# Patient Record
Sex: Female | Born: 1940 | Race: White | Hispanic: No | Smoking: Former smoker
Health system: Southern US, Community
[De-identification: ages and names within clinical notes are randomized; demographics above are authoritative.]

## PROBLEM LIST (undated history)

## (undated) DIAGNOSIS — K317 Polyp of stomach and duodenum: Secondary | ICD-10-CM

## (undated) DIAGNOSIS — M1990 Unspecified osteoarthritis, unspecified site: Secondary | ICD-10-CM

## (undated) DIAGNOSIS — K219 Gastro-esophageal reflux disease without esophagitis: Secondary | ICD-10-CM

## (undated) DIAGNOSIS — M810 Age-related osteoporosis without current pathological fracture: Secondary | ICD-10-CM

## (undated) DIAGNOSIS — D649 Anemia, unspecified: Secondary | ICD-10-CM

## (undated) DIAGNOSIS — C4491 Basal cell carcinoma of skin, unspecified: Secondary | ICD-10-CM

## (undated) DIAGNOSIS — K635 Polyp of colon: Secondary | ICD-10-CM

## (undated) DIAGNOSIS — M25562 Pain in left knee: Secondary | ICD-10-CM

## (undated) DIAGNOSIS — C50919 Malignant neoplasm of unspecified site of unspecified female breast: Secondary | ICD-10-CM

## (undated) DIAGNOSIS — I8390 Asymptomatic varicose veins of unspecified lower extremity: Secondary | ICD-10-CM

## (undated) HISTORY — DX: Basal cell carcinoma of skin, unspecified: C44.91

## (undated) HISTORY — DX: Malignant neoplasm of unspecified site of unspecified female breast: C50.919

## (undated) HISTORY — DX: Unspecified osteoarthritis, unspecified site: M19.90

## (undated) HISTORY — DX: Age-related osteoporosis without current pathological fracture: M81.0

## (undated) HISTORY — DX: Anemia, unspecified: D64.9

## (undated) HISTORY — DX: Asymptomatic varicose veins of unspecified lower extremity: I83.90

## (undated) HISTORY — DX: Gastro-esophageal reflux disease without esophagitis: K21.9

## (undated) HISTORY — DX: Polyp of colon: K63.5

## (undated) HISTORY — DX: Pain in left knee: M25.562

## (undated) HISTORY — PX: TONSILLECTOMY: SUR1361

## (undated) HISTORY — DX: Polyp of stomach and duodenum: K31.7

---

## 1989-06-23 HISTORY — PX: OTHER SURGICAL HISTORY: SHX169

## 1993-10-23 DIAGNOSIS — C50919 Malignant neoplasm of unspecified site of unspecified female breast: Secondary | ICD-10-CM

## 1993-10-23 HISTORY — PX: BREAST LUMPECTOMY: SHX2

## 1993-10-23 HISTORY — DX: Malignant neoplasm of unspecified site of unspecified female breast: C50.919

## 1997-11-27 ENCOUNTER — Ambulatory Visit (HOSPITAL_COMMUNITY): Admission: RE | Admit: 1997-11-27 | Discharge: 1997-11-27 | Payer: Self-pay | Admitting: Obstetrics and Gynecology

## 1998-12-10 ENCOUNTER — Other Ambulatory Visit: Admission: RE | Admit: 1998-12-10 | Discharge: 1998-12-10 | Payer: Self-pay | Admitting: Obstetrics and Gynecology

## 1999-09-01 ENCOUNTER — Ambulatory Visit (HOSPITAL_COMMUNITY): Admission: RE | Admit: 1999-09-01 | Discharge: 1999-09-01 | Payer: Self-pay | Admitting: Gastroenterology

## 1999-12-13 ENCOUNTER — Other Ambulatory Visit: Admission: RE | Admit: 1999-12-13 | Discharge: 1999-12-13 | Payer: Self-pay | Admitting: Obstetrics and Gynecology

## 2001-01-14 ENCOUNTER — Other Ambulatory Visit: Admission: RE | Admit: 2001-01-14 | Discharge: 2001-01-14 | Payer: Self-pay | Admitting: Obstetrics and Gynecology

## 2002-05-01 ENCOUNTER — Other Ambulatory Visit: Admission: RE | Admit: 2002-05-01 | Discharge: 2002-05-01 | Payer: Self-pay | Admitting: Obstetrics and Gynecology

## 2004-06-01 ENCOUNTER — Other Ambulatory Visit: Admission: RE | Admit: 2004-06-01 | Discharge: 2004-06-01 | Payer: Self-pay | Admitting: Obstetrics and Gynecology

## 2005-02-20 DIAGNOSIS — K635 Polyp of colon: Secondary | ICD-10-CM

## 2005-02-20 HISTORY — DX: Polyp of colon: K63.5

## 2005-07-19 ENCOUNTER — Other Ambulatory Visit: Admission: RE | Admit: 2005-07-19 | Discharge: 2005-07-19 | Payer: Self-pay | Admitting: Obstetrics and Gynecology

## 2009-05-23 DIAGNOSIS — D649 Anemia, unspecified: Secondary | ICD-10-CM

## 2009-05-23 HISTORY — DX: Anemia, unspecified: D64.9

## 2009-06-23 DIAGNOSIS — K317 Polyp of stomach and duodenum: Secondary | ICD-10-CM

## 2009-06-23 HISTORY — DX: Polyp of stomach and duodenum: K31.7

## 2009-06-23 LAB — HM COLONOSCOPY

## 2011-09-26 LAB — HM MAMMOGRAPHY

## 2011-12-20 ENCOUNTER — Other Ambulatory Visit (INDEPENDENT_AMBULATORY_CARE_PROVIDER_SITE_OTHER): Payer: Medicare Other

## 2011-12-20 ENCOUNTER — Ambulatory Visit (INDEPENDENT_AMBULATORY_CARE_PROVIDER_SITE_OTHER): Payer: Medicare Other | Admitting: Obstetrics and Gynecology

## 2011-12-20 DIAGNOSIS — M810 Age-related osteoporosis without current pathological fracture: Secondary | ICD-10-CM

## 2012-01-25 ENCOUNTER — Ambulatory Visit (HOSPITAL_COMMUNITY)
Admission: RE | Admit: 2012-01-25 | Discharge: 2012-01-25 | Disposition: A | Discharge status: Home / Self Care | Payer: Medicare Other | Source: Ambulatory Visit | Attending: Obstetrics and Gynecology | Admitting: Obstetrics and Gynecology

## 2012-01-25 ENCOUNTER — Other Ambulatory Visit: Payer: Self-pay | Admitting: Obstetrics and Gynecology

## 2012-01-25 DIAGNOSIS — Z13820 Encounter for screening for osteoporosis: Secondary | ICD-10-CM | POA: Insufficient documentation

## 2012-01-25 DIAGNOSIS — Z780 Asymptomatic menopausal state: Secondary | ICD-10-CM | POA: Insufficient documentation

## 2012-05-01 ENCOUNTER — Encounter: Payer: Self-pay | Admitting: *Deleted

## 2012-05-02 ENCOUNTER — Encounter: Payer: Self-pay | Admitting: *Deleted

## 2012-07-18 ENCOUNTER — Encounter: Payer: Self-pay | Admitting: Family Medicine

## 2012-07-18 ENCOUNTER — Ambulatory Visit (INDEPENDENT_AMBULATORY_CARE_PROVIDER_SITE_OTHER): Payer: Medicare Other | Admitting: Family Medicine

## 2012-07-18 VITALS — BP 130/78 | HR 64 | Ht 64.0 in | Wt 136.0 lb

## 2012-07-18 DIAGNOSIS — R6889 Other general symptoms and signs: Secondary | ICD-10-CM

## 2012-07-18 DIAGNOSIS — Z79899 Other long term (current) drug therapy: Secondary | ICD-10-CM

## 2012-07-18 DIAGNOSIS — Z23 Encounter for immunization: Secondary | ICD-10-CM

## 2012-07-18 DIAGNOSIS — D509 Iron deficiency anemia, unspecified: Secondary | ICD-10-CM | POA: Insufficient documentation

## 2012-07-18 LAB — CBC WITH DIFFERENTIAL/PLATELET
Basophils Absolute: 0.1 10*3/uL (ref 0.0–0.1)
Eosinophils Relative: 1 % (ref 0–5)
HCT: 41.1 % (ref 36.0–46.0)
Hemoglobin: 14 g/dL (ref 12.0–15.0)
Lymphocytes Relative: 27 % (ref 12–46)
MCV: 91.9 fL (ref 78.0–100.0)
Monocytes Absolute: 0.5 10*3/uL (ref 0.1–1.0)
Monocytes Relative: 11 % (ref 3–12)
RDW: 12.7 % (ref 11.5–15.5)
WBC: 4.5 10*3/uL (ref 4.0–10.5)

## 2012-07-18 LAB — COMPREHENSIVE METABOLIC PANEL
ALT: 16 U/L (ref 0–35)
AST: 20 U/L (ref 0–37)
Alkaline Phosphatase: 49 U/L (ref 39–117)
Sodium: 140 mEq/L (ref 135–145)
Total Bilirubin: 0.8 mg/dL (ref 0.3–1.2)
Total Protein: 6.5 g/dL (ref 6.0–8.3)

## 2012-07-18 MED ORDER — INFLUENZA VIRUS VACC SPLIT PF IM SUSP: 0.5000 mL | Freq: Once | INTRAMUSCULAR | Status: DC

## 2012-07-18 MED ORDER — OMEPRAZOLE 20 MG PO CPDR: 20.0000 mg | DELAYED_RELEASE_CAPSULE | Freq: Every day | ORAL | Status: DC

## 2012-07-18 NOTE — Patient Instructions (Addendum)
Please try and cut back to just one glass of wine each night. I would try [facility] or other topical medication instead of Voltaren to your arm muscle.  I recommend trial of Active Release technique, performed by certain chiropractors ,including those at [facility] on [address] (Dr. Thereasa Distance).  I have also heard good things about Okey Regal, a massage therapist who does myofascial massage for pain relief.  Take omeprazole every day to protect your stomach from the naproxen you are taking daily for joint pains.

## 2012-07-18 NOTE — Progress Notes (Signed)
Chief Complaint  Patient presents with  . Former patient to re-establish    patient is fasting today.   HPI: Patient presents to re-establish care, wanting to have fasting labs done today.  She is complaining of feeling cold frequently, which is new for her.  She has a h/o iron deficiency anemia.  She denies abdominal pain, black or bloody stools, heartburn or dysphagia.  Stopped taking omeprazole because no longer having symptoms/reflux.  She continues to take NSAIDs daily for joint pains.  Plays tennis 5x/week.  Has pain in her R upper arm with certain movements, in the muscle.  Denies any shoulder joint pain.  Using Voltaren gel with some benefit, less sore.  Icing seems to help.  Hurts when she reaches across and twists in certain movements (ie when playing tennis).  Currently isn't painful.  Had some R hip pain, after traveling 3 weeks in [place] in small car.  Past Medical History  Diagnosis Date  . Breast cancer     h/o left breast CA-s/p lumpectomy,radiation and Tamoxifen x 5years  . GERD (gastroesophageal reflux disease)     Dr.Mann  . OA (osteoarthritis)   . Varicose veins   . Osteopenia   . Left knee pain     h/o-Dr.Collins  . BCC (basal cell carcinoma)     RLE-Dr.Lomax  . Colon polyps 5/06    hyperplastic  . Anemia 05/2009    iron deficient  . Gastric polyps 06/2009    small(Dr.Mann) EGD and Colonoscopy   . Osteoporosis     managed by Dr. Pennie Rushing   Past Surgical History  Procedure Date  . Upper gastrointestinal endoscopy 07/05/2009    normal esophagus,GEJ and small bowel. Multiple small vessel gastric polyps ablated with tip of snare-four removed.  . Tonsillectomy age 81  . Breast lumpectomy 1995    left breast  . Varicose vein treatment 1990's    LLE   History   Social History  . Marital Status: Married    Spouse Name: N/A    Number of Children: 3  . Years of Education: N/A   Occupational History  . retired Database administrator    Social  History Main Topics  . Smoking status: Former Smoker    Quit date: 10/23/1981  . Smokeless tobacco: Never Used  . Alcohol Use: Yes     14 glasses of red wine per week.  . Drug Use: No  . Sexually Active: Not on file   Other Topics Concern  . Not on file   Social History Narrative   Married, lives with husband, 2 cats, 1 dog.  Has 3 sons, one with alcoholism (did well in rehab at [facility]), 1 in [place], 1 in [place]   Family History  Problem Relation Age of Onset  . Hypertension Mother   . Osteoporosis Mother   . Alcohol abuse Son   . Heart disease Maternal Uncle   . Cancer Paternal Grandmother 69    breast cancer  . Diabetes Neg Hx    Current outpatient prescriptions:aspirin 81 MG tablet, Take 81 mg by mouth daily., Disp: , Rfl: ;  calcium carbonate (OS-CAL) 600 MG TABS, Take 600 mg by mouth 2 (two) times daily with a meal., Disp: , Rfl: ;  ferrous sulfate 325 (65 FE) MG tablet, Take 650 mg by mouth daily with breakfast. , Disp: , Rfl: ;  glucosamine-chondroitin 500-400 MG tablet, Take 2 tablets by mouth daily. , Disp: , Rfl:  Multiple Vitamins-Minerals (MULTIVITAMIN  WITH MINERALS) tablet, Take 1 tablet by mouth daily., Disp: , Rfl: ;  naproxen (NAPROSYN) 500 MG tablet, Take 500 mg by mouth 2 (two) times daily with a meal., Disp: , Rfl: ;  raloxifene (EVISTA) 60 MG tablet, Take 60 mg by mouth daily., Disp: , Rfl: ;  omeprazole (PRILOSEC) 20 MG capsule, Take 20 mg by mouth daily., Disp: , Rfl:  Current facility-administered medications:influenza  inactive virus vaccine (FLUZONE/FLUARIX) injection 0.5 mL, 0.5 mL, Intramuscular, Once, Joselyn Arrow, MD  No Known Allergies  ROS:  Denies fevers, weight changes, URI symptoms, cough, shortness of breath, chest pain, palpitations, nausea, vomiting, bowel changes, urinary complaints, skin rashes, depression/anxiety or other concerns, except as noted in HPI.  PHYSICAL EXAM: BP 130/78  Pulse 64  Ht 5\' 4"  (1.626 m)  Wt 136 lb (61.689  kg)  BMI 23.34 kg/m2 Well developed, pleasant female in no distress HEENT:  PERRL, EOMI, conjunctiva clear.  OP clear Neck: no lymphadenopathy, thyromegaly, mass or bruit Heart: regular rate and rhythm without murmur Lungs: clear bilaterally Abdomen: soft, nontender, no organomegaly or mass, normal bowel sounds. Extremities: no clubbing, cyanosis or edema, 2+ pulse. Slight tenderness anterior R shoulder with certain movements.  No swelling, warmth.  FROM Skin: no rash/lesions Psych: normal mood, affect, hygiene and grooming  ASSESSMENT/PLAN: 1. Iron deficiency anemia  CBC with Differential, Iron, Ferritin  2. Need for prophylactic vaccination and inoculation against influenza  influenza  inactive virus vaccine (FLUZONE/FLUARIX) injection 0.5 mL  3. Encounter for long-term (current) use of other medications  Comprehensive metabolic panel  4. Temperature intolerance  TSH    Had Tdap through [facility] after injury within the last 2 months--get date to enter in computer (signing another release today). 08/2010 normal vitamin D and lipids--these don't need to be repeated.  Check CBC, c-met and TSH, ferritin and iron given her history and complaints.  Reviewed risks of NSAIDs.  Encouraged restarting omeprazole daily for prevention of ulcers/GI complications from chronic NSAIDs.  Discussed need to monitor kidney/liver while on NSAIDs.  Stop Voltaren--may not be getting much additional benefit from oral NSAIDs.  Can try other topicals such as icy hot.  Shown some stretches.  Recommend heat/ice.  Can try massage or chiropractor (vs refer for PT) for ongoing pain in RUE (numbers given)

## 2012-07-19 ENCOUNTER — Encounter: Payer: Self-pay | Admitting: Family Medicine

## 2012-07-19 LAB — FERRITIN: Ferritin: 20 ng/mL (ref 10–291)

## 2012-08-15 ENCOUNTER — Telehealth: Payer: Self-pay | Admitting: Obstetrics and Gynecology

## 2012-08-15 NOTE — Telephone Encounter (Signed)
[facility] pt 

## 2012-08-16 NOTE — Telephone Encounter (Signed)
Tc to pt per telephone call. Pt wants to proceed with Prolia discussed with vph in 11/2011. Informed pt will begin  Prolia benefit verification process and fu with pt accordingly. Pt voices understanding.

## 2012-09-12 ENCOUNTER — Telehealth: Payer: Self-pay

## 2012-09-12 NOTE — Telephone Encounter (Signed)
Pt given all financial information rgdg Prolia. Pt opts to cb if decides to proceed with injection. Pt agrees.

## 2013-01-02 ENCOUNTER — Other Ambulatory Visit: Payer: Self-pay | Admitting: Family Medicine

## 2013-01-02 ENCOUNTER — Ambulatory Visit: Payer: Medicare Other | Admitting: Family Medicine

## 2013-01-02 ENCOUNTER — Encounter: Payer: Self-pay | Admitting: Family Medicine

## 2013-01-02 VITALS — BP 122/80 | HR 68 | Ht 64.0 in | Wt 136.0 lb

## 2013-01-02 DIAGNOSIS — D509 Iron deficiency anemia, unspecified: Secondary | ICD-10-CM

## 2013-01-02 DIAGNOSIS — Z0000 Encounter for general adult medical examination without abnormal findings: Secondary | ICD-10-CM

## 2013-01-02 DIAGNOSIS — M810 Age-related osteoporosis without current pathological fracture: Secondary | ICD-10-CM | POA: Insufficient documentation

## 2013-01-02 LAB — COMPREHENSIVE METABOLIC PANEL
ALT: 13 U/L (ref 0–35)
Albumin: 4.5 g/dL (ref 3.5–5.2)
CO2: 27 mEq/L (ref 19–32)
Glucose, Bld: 90 mg/dL (ref 70–99)
Potassium: 4.2 mEq/L (ref 3.5–5.3)
Sodium: 139 mEq/L (ref 135–145)
Total Bilirubin: 0.7 mg/dL (ref 0.3–1.2)
Total Protein: 6.6 g/dL (ref 6.0–8.3)

## 2013-01-02 LAB — CBC WITH DIFFERENTIAL/PLATELET
Hemoglobin: 12 g/dL (ref 12.0–15.0)
Lymphocytes Relative: 27 % (ref 12–46)
Lymphs Abs: 1.5 10*3/uL (ref 0.7–4.0)
Monocytes Relative: 9 % (ref 3–12)
Neutro Abs: 3.3 10*3/uL (ref 1.7–7.7)
Neutrophils Relative %: 61 % (ref 43–77)
Platelets: 292 10*3/uL (ref 150–400)
RBC: 4.44 MIL/uL (ref 3.87–5.11)
WBC: 5.4 10*3/uL (ref 4.0–10.5)

## 2013-01-02 LAB — LIPID PANEL
Cholesterol: 237 mg/dL — ABNORMAL HIGH (ref 0–200)
HDL: 83 mg/dL (ref >39)
Total CHOL/HDL Ratio: 2.9 Ratio
Triglycerides: 96 mg/dL (ref <150)

## 2013-01-02 LAB — POCT URINALYSIS DIPSTICK
Bilirubin, UA: NEGATIVE
Blood, UA: NEGATIVE
Glucose, UA: NEGATIVE
Leukocytes, UA: NEGATIVE
Nitrite, UA: NEGATIVE
Urobilinogen, UA: NEGATIVE

## 2013-01-02 NOTE — Patient Instructions (Signed)

## 2013-01-02 NOTE — Progress Notes (Signed)
Chief Complaint  Patient presents with  . Annual Exam    fasting annual exam/med check. Did not do vision today as she just had one. Has a place on her back that she would like you to look at. No pap, sees gyn and is UTD.   Evelyn White is a 72 y.o. female who presents for a complete physical.  She has the following concerns:  Seeing Dr. Thereasa Distance for L leg pain (resolved) and R arm and leg pain. Husband has been very ill (pneumonia, lung nodules, suspected cancer, with large pleural effusions)  Continues to drink 2 glasses of wine each night, sometimes a third to help her relax from the recent stress  Immunization History  Administered Date(s) Administered  . Influenza Split 07/18/2012  . Pneumococcal Polysaccharide 01/22/2008  . Td 07/20/2004  . Tdap 04/26/2012  . Zoster 06/02/2009   Last Pap smear:  Through Dr. Pennie Rushing, UTD Last mammogram:  Yesterday, normal Last colonoscopy: 06/2009 Last DEXA:  1 year ago with Dr. Pennie Rushing.  Believes she may be starting Prolia injections soon Dentist: twice year Ophtho: yearly Exercise: tennis 5x/week; personal trainer 2x/week (weights and cardio) Has living will and healthcare power of attorney  Past Medical History  Diagnosis Date  . Breast cancer     h/o left breast CA-s/p lumpectomy,radiation and Tamoxifen x 5years  . GERD (gastroesophageal reflux disease)     Dr.Mann  . OA (osteoarthritis)   . Varicose veins   . Left knee pain     h/o-Dr.Collins  . BCC (basal cell carcinoma)     RLE-Dr.Lomax  . Colon polyps 5/06    hyperplastic  . Anemia 05/2009    iron deficient  . Gastric polyps 06/2009    small(Dr.Mann) EGD and Colonoscopy   . Osteoporosis     managed by Dr. Pennie Rushing  . Breast cancer 1995    left    Past Surgical History  Procedure Laterality Date  . Upper gastrointestinal endoscopy  07/05/2009    normal esophagus,GEJ and small bowel. Multiple small vessel gastric polyps ablated with tip of snare-four removed.   . Tonsillectomy  age 27  . Breast lumpectomy  1995    left breast  . Varicose vein treatment  1990's    LLE    History   Social History  . Marital Status: Married    Spouse Name: N/A    Number of Children: 3  . Years of Education: N/A   Occupational History  . retired Database administrator    Social History Main Topics  . Smoking status: Former Smoker    Quit date: 10/23/1981  . Smokeless tobacco: Never Used  . Alcohol Use: Yes     Comment: 14 glasses of red wine per week. (2/day)  . Drug Use: No  . Sexually Active: Not on file   Other Topics Concern  . Not on file   Social History Narrative   Married, lives with husband, 2 cats, 1 dog.  Has 3 sons, one with alcoholism (did well in rehab at [facility]), 1 in [place], 1 in [place]. 2 granddaughters    Family History  Problem Relation Age of Onset  . Hypertension Mother   . Osteoporosis Mother   . Alcohol abuse Son   . Heart disease Maternal Uncle   . Cancer Paternal Grandmother 63    breast cancer  . Diabetes Neg Hx     Current outpatient prescriptions:acetaminophen (TYLENOL) 325 MG tablet, Take 650 mg by  mouth 2 (two) times daily., Disp: , Rfl: ;  aspirin 81 MG tablet, Take 81 mg by mouth daily., Disp: , Rfl: ;  calcium carbonate (OS-CAL) 600 MG TABS, Take 600 mg by mouth 2 (two) times daily with a meal., Disp: , Rfl: ;  fish oil-omega-3 fatty acids 1000 MG capsule, Take 2 g by mouth daily., Disp: , Rfl:  glucosamine-chondroitin 500-400 MG tablet, Take 2 tablets by mouth daily. , Disp: , Rfl: ;  Multiple Vitamins-Minerals (MULTIVITAMIN WITH MINERALS) tablet, Take 1 tablet by mouth daily., Disp: , Rfl: ;  raloxifene (EVISTA) 60 MG tablet, Take 60 mg by mouth daily., Disp: , Rfl: ;  vitamin B-12 (CYANOCOBALAMIN) 100 MCG tablet, Take 100 mcg by mouth daily., Disp: , Rfl: ;  vitamin E 100 UNIT capsule, Take 100 Units by mouth daily., Disp: , Rfl:  ferrous sulfate 325 (65 FE) MG tablet, Take 650 mg by mouth  daily with breakfast. , Disp: , Rfl:   No Known Allergies  ROS:  The patient denies anorexia, fever, weight changes, headaches,  vision changes, decreased hearing, ear pain, sore throat, breast concerns, chest pain, palpitations, dizziness, syncope, dyspnea on exertion, cough, swelling, nausea, vomiting, diarrhea, constipation, abdominal pain, melena, hematochezia, indigestion/heartburn, hematuria, incontinence, dysuria, vaginal bleeding, discharge, odor or itch, genital lesions, joint pains, numbness, tingling, weakness, tremor, suspicious skin lesions, depression, anxiety, abnormal bleeding/bruising, or enlarged lymph nodes. +lump noted on back when working with trainer.  Not painful, she can't feel, but she was told to get evaluated. Some concern about her memory--trouble sometimes remembering names, dates of events  PHYSICAL EXAM: BP 122/80  Pulse 68  Ht 5\' 4"  (1.626 m)  Wt 136 lb (61.689 kg)  BMI 23.33 kg/m2  General Appearance:    Alert, cooperative, no distress, appears stated age  Head:    Normocephalic, without obvious abnormality, atraumatic  Eyes:    PERRL, conjunctiva/corneas clear, EOM's intact, fundi    benign  Ears:    Normal TM's and external ear canals  Nose:   Nares normal, mucosa normal, no drainage or sinus   tenderness  Throat:   Lips, mucosa, and tongue normal; teeth and gums normal  Neck:   Supple, no lymphadenopathy;  thyroid:  no   enlargement/tenderness/nodules; no carotid   bruit or JVD  Back:    Spine nontender, no curvature, ROM normal, no CVA     Tenderness. Vague soft tissue swelling in mid-upper thoracic back, slightly left of midline. No pore seen to suspect cyst, so likely lipoma  Lungs:     Clear to auscultation bilaterally without wheezes, rales or     ronchi; respirations unlabored  Chest Wall:    No tenderness or deformity   Heart:    Regular rate and rhythm, S1 and S2 normal, no murmur, rub   or gallop  Breast Exam:    Deferred to GYN  Abdomen:      Soft, non-tender, nondistended, normoactive bowel sounds,    no masses, no hepatosplenomegaly  Genitalia:    Deferred to GYN     Extremities:   No clubbing, cyanosis or edema  Pulses:   2+ and symmetric all extremities  Skin:   Skin color, texture, turgor normal, no rashes or lesions  Lymph nodes:   Cervical, supraclavicular, and axillary nodes normal  Neurologic:   CNII-XII intact, normal strength, sensation and gait; reflexes 2+ and symmetric throughout          Psych:   Normal mood, affect, hygiene and  grooming.    ASSESSMENT/PLAN:  Routine general medical examination at a health care facility - Plan: POCT Urinalysis Dipstick  Osteoporosis, unspecified - Plan: Vitamin D 25 hydroxy  Personal history of breast cancer  Iron deficiency anemia - Plan: CBC with Differential  Encounter for long-term (current) use of other medications - Plan: Comprehensive metabolic panel  Pure hypercholesterolemia - Plan: Lipid panel  Memory loss - Plan: Vitamin B12  Osteoporosis--f/u with Dr. Pennie Rushing Subcutaneous mass on L upper back--suspect lipoma.  Plans to see derm next week for routine exam  Discussed monthly self breast exams and yearly mammograms; at least 30 minutes of aerobic activity at least 5 days/week, weight-bearing exercise; proper sunscreen use reviewed; healthy diet, including goals of calcium and vitamin D intake and alcohol recommendations (less than or equal to 1 drink/day) reviewed; regular seatbelt use; changing batteries in smoke detectors.  Immunization recommendations discussed, UTD.  Colonoscopy recommendations reviewed, UTD.

## 2013-01-03 ENCOUNTER — Encounter: Payer: Self-pay | Admitting: Family Medicine

## 2013-01-03 LAB — VITAMIN D 25 HYDROXY (VIT D DEFICIENCY, FRACTURES): Vit D, 25-Hydroxy: 64 ng/mL (ref 30–89)

## 2013-01-03 LAB — IRON: Iron: 30 ug/dL — ABNORMAL LOW (ref 42–145)

## 2013-01-21 ENCOUNTER — Encounter: Payer: Self-pay | Admitting: Family Medicine

## 2013-01-21 ENCOUNTER — Telehealth: Payer: Self-pay | Admitting: Internal Medicine

## 2013-01-21 NOTE — Telephone Encounter (Signed)
I sent pt an e-mail just checking in, to let me know if she needed anything.  I spoke with her neighbor/friend Evelyn White (who called about her) who states her husband is home on hospice, and will be dying in the next few days.  Family is flying in to say goodbye.  That Evelyn White isn't really handling it well at all.  She reports noticing some issues with her memory, possible dementia prior to this, also mentioned by one of her sons.  I advised Evelyn White to let Evelyn White know about and utilize the grief counselors through Hosp[facility], and to call for meds if needed.  Briefly spoke about side effects and risks with concomitant use of alcohol.  Evelyn White said she had xanax, could she offer to pt if she won't call me--we spoke about the fact that her dose of 1mg  is too high for pt--recommend starting at just 1/4 tablet, could use 1/2 if taking at bedtime without alcohol.  I'm happy to send rx for 0.5 mg (for pt to use 1/2 to 1 q8hrs prn) if she calls and is willing to take, but will need to speak to pt about first. Will try and address issue of memory/dementia in future, but will need to get through this first.  I thanked Evelyn White for being such a good friend and looking out for her, and to keep me updated

## 2013-01-21 NOTE — Telephone Encounter (Signed)
[name] is calling about Evelyn White. Shakesha does not know that norma is calling about her. [name] states that pt husband is going to be dying in the few days and pt is down, confused and has anxiety and she is not sure what to do for this patient and she didn't know if you could give pt something for her to calm her with everything that is going on. Send to [facility]

## 2013-02-10 ENCOUNTER — Telehealth: Payer: Self-pay | Admitting: Internal Medicine

## 2013-02-10 NOTE — Telephone Encounter (Signed)
Many factors can contribute to diarrhea.  Try using imodium and avoid dairy.  Can reintroduce daily after a week if diarrhea resolves.  Schedule OV if ongoing diarrhea

## 2013-02-10 NOTE — Telephone Encounter (Signed)
Patient advised of Dr.[name]'s recommendations. 

## 2013-02-10 NOTE — Telephone Encounter (Signed)
Pt states that her husband died a few weeks ago and she had family here to help her and now she is having some trouble and is having diarrhea in the middle of the night and is waking her up and pt would like something for that. Send to [facility]

## 2013-02-19 ENCOUNTER — Encounter: Payer: Self-pay | Admitting: Medical

## 2013-02-19 ENCOUNTER — Ambulatory Visit (INDEPENDENT_AMBULATORY_CARE_PROVIDER_SITE_OTHER): Payer: Medicare Other | Admitting: Medical

## 2013-02-19 VITALS — BP 110/70 | HR 82 | Temp 97.7°F | Resp 16 | Wt 136.0 lb

## 2013-02-19 DIAGNOSIS — W57XXXA Bitten or stung by nonvenomous insect and other nonvenomous arthropods, initial encounter: Secondary | ICD-10-CM

## 2013-02-19 MED ORDER — DOXYCYCLINE HYCLATE 100 MG PO TABS: 100.0000 mg | ORAL_TABLET | Freq: Two times a day (BID) | ORAL | Status: DC

## 2013-02-19 NOTE — Progress Notes (Signed)
Subjective:  Evelyn White is a 72 y.o. female who presents with tick bite.  Pulled tick off back of neck 2 wk ago.  Didn't get concerned until started having some aches in her knees.  Otherwise no symptoms.  No rash, no fever, no NVD, no headache, or other symptoms.  No other aggravating or relieving factors.    No other c/o.  The following portions of the patient's history were reviewed and updated as appropriate: allergies, current medications, past family history, past medical history, past social history, past surgical history and problem list.  ROS Otherwise as in subjective above  Objective: Physical Exam  Vital signs reviewed  General appearance: alert, no distress, WD/WN Skin: small 3mm diameter raised erythematous lesion on back of left neck in hairline with central area of erythema and depression suggested of insect/possible tick bite Neck: supple, no lymphadenopathy, no thyromegaly, no masses Pulses: 2+ radial pulses Ext: no edema   Assessment: Encounter Diagnosis  Name Primary?  . Tick bite Yes    Plan: Discussed her concerns, findings, asymptomatic other than knee aches which can be attributed to her playing tennis recently.  Advised of symptoms suggestive of Lyme or RMSF, gave script printed for Doxycyline in the event symptoms do occur, but for now she will hold off on taking the antibiotic and use watch and wait approach.  Handout given, advised she call/return if any concern or question.

## 2013-02-19 NOTE — Patient Instructions (Signed)
Lyme Disease You may have been bitten by a tick and are to watch for the development of Lyme Disease. Lyme Disease is an infection that is caused by a bacteria The bacteria causing this disease is named Borreilia burgdorferi. If a tick is infected with this bacteria and then bites you, then Lyme Disease may occur. These ticks are carried by deer and rodents such as rabbits and mice and infest grassy as well as forested areas. Fortunately most tick bites do not cause Lyme Disease.  Lyme Disease is easier to prevent than to treat. First, covering your legs with clothing when walking in areas where ticks are possibly abundant will prevent their attachment because ticks tend to stay within inches of the ground. Second, using insecticides containing DEET can be applied on skin or clothing. Last, because it takes about 12 to 24 hours for the tick to transmit the disease after attachment to the human host, you should inspect your body for ticks twice a day when you are in areas where Lyme Disease is common. You must look thoroughly when searching for ticks. The Ixodes tick that carries Lyme Disease is very small. It is around the size of a sesame seed (picture of tick is not actual size). Removal is best done by grasping the tick by the head and pulling it out. Do not to squeeze the body of the tick. This could inject the infecting bacteria into the bite site. Wash the area of the bite with an antiseptic solution after removal.  Lyme Disease is a disease that may affect many body systems. Because of the small size of the biting tick, most people do not notice being bitten. The first sign of an infection is usually a round red rash that extends out from the center of the tick bite. The center of the lesion may be blood colored (hemorrhagic) or have tiny blisters (vesicular). Most lesions have bright red outer borders and partial central clearing. This rash may extend out many inches in diameter, and multiple lesions may  be present. Other symptoms such as fatigue, headaches, chills and fever, general achiness and swelling of lymph glands may also occur. If this first stage of the disease is left untreated, these symptoms may gradually resolve by themselves, or progressive symptoms may occur because of spread of infection to other areas of the body.  Follow up with your caregiver to have testing and treatment if you have a tick bite and you develop any of the above complaints. Your caregiver may recommend preventative (prophylactic) medications which kill bacteria (antibiotics). Once a diagnosis of Lyme Disease is made, antibiotic treatment is highly likely to cure the disease. Effective treatment of late stage Lyme Disease may require longer courses of antibiotic therapy.  MAKE SURE YOU:   Understand these instructions.  Will watch your condition.  Will get help right away if you are not doing well or get worse. Document Released: 01/15/2001 Document Revised: 01/01/2012 Document Reviewed: 03/19/2009 [facility] Patient Information 2013 [place], [place].    [place] Spotted Fever [place] Spotted Fever (RMSF) is the oldest known tick-borne disease of people in the [place]. This disease was named because it was first described among people in the [facility] area who had an illness characterized by a rash with red-purple-black spots. This disease is caused by a rickettsia (Rickettsia rickettsii), a bacteria carried by the tick. The [facility] wood tick and the American dog tick, acquire and transmit the RMSF bacteria (pictures NOT actual  size). When a larval, nymphal or adult tick feeds on an infected rodent or larger animal, the tick can become infected. Infected adult ticks then feed on people who may then get RMSF. The tick transmits the disease to humans during a prolonged period of feeding that lasts many hours, days or even a couple weeks. The bite is painless and frequently goes unnoticed.  An infected female tick may also pass the rickettsial bacteria to her eggs that then may mature to be infected adult ticks. The rickettsia that causes RMSF can also get into a person's body through damaged skin. A tick bite is not necessary. People can get RMSF if they crush a tick and get it's blood or body fluids on their skin through a small cut or sore.  DIAGNOSIS Diagnosis is made by laboratory tests.  TREATMENT Treatment is with antibiotics (medications that kill rickettsia and other bacteria). Immediate treatment usually prevents death. GEOGRAPHIC RANGE This disease was reported only in the [facility] until 1931. RMSF has more recently been described among individuals in all states except [place], [place] and [place]. The highest reported incidences of RMSF now occur among residents of [place], [place], [place] and [address]. TIME OF YEAR  Most cases are diagnosed during late spring and summer when ticks are most active. However, especially in the warmer [place] states, a few cases occur during the winter. SYMPTOMS   Symptoms of RMSF begin from 2 to 14 days after a tick bite. The most common early symptoms are fever, muscle aches and headache followed by nausea (feeling sick to your stomach) or vomiting.  The RMSF rash is typically delayed until 3 or more days after symptom onset, and eventually develops in 9 of 10 infected patients by the 5th day of illness. If the disease is not treated it can cause death. If you get a fever, headache, muscle aches, rash, nausea or vomiting within 2 weeks of a possible tick bite or exposure you should see your caregiver immediately. PREVENTION Ticks prefer to hide in shady, moist ground litter. They can often be found above the ground clinging to tall grass, brush, shrubs and low tree branches. They also inhabit lawns and gardens, especially at the edges of woodlands and around old stone walls. Within the areas where ticks generally live, no  naturally vegetated area can be considered completely free of infected ticks. The best precaution against RMSF is to avoid contact with soil, leaf litter and vegetation as much as possible in tick infested areas. For those who enjoy gardening or walking in their yards, clear brush and mow tall grass around houses and at the edges of gardens. This may help reduce the tick population in the immediate area. Applications of chemical insecticides by a licensed professional in the spring (late May) and Fall (September) will also control ticks, especially in heavily infested areas. Treatment will never get rid of all the ticks. Getting rid of small animal populations that host ticks will also decrease the tick population. When working in the garden, [employer], or handling soil and vegetation, wear light-colored protective clothing and gloves. Spot-check often to prevent ticks from reaching the skin. Ticks cannot jump or fly. They will not drop from an above-ground perch onto a passing animal. Once a tick gains access to human skin it climbs upward until it reaches a more protected area. For example, the back of the knee, groin, navel, armpit, ears or nape of the neck. It then begins the slow process of embedding  itself in the skin. Campers, hikers, field workers, and others who spend time in wooded, brushy or tall grassy areas can avoid exposure to ticks by using the following precautions:  Wear light-colored clothing with a tight weave to spot ticks more easily and prevent contact with the skin.  Wear long pants tucked into socks, long-sleeved shirts tucked into pants and enclosed shoes or boots along with insect repellent.  Spray clothes with insect repellent containing either DEET or Permethrin. Only DEET can be used on exposed skin. Follow the manufacturer's directions carefully.  Wear a hat and keep long hair pulled back.  Stay on cleared, well-worn trails whenever possible.  Spot-check yourself and  others often for the presence of ticks on clothes. If you find one, there are likely to be others. Check thoroughly.  Remove clothes after leaving tick-infested areas. If possible, wash them to eliminate any unseen ticks. Check yourself, your children and any pets from head to toe for the presence of ticks.  Shower and shampoo. You can greatly reduce your chances of contracting RMSF if you remove attached ticks as soon as possible. Regular checks of the body, including all body sites covered by hair (head, armpits, genitals), allow removal of the tick before rickettsial transmission. To remove an attached tick, use a forceps or tweezers to detach the intact tick without leaving mouth parts in the skin. The tick bite wound should be cleansed after tick removal. Remember the most common symptoms of RMSF are fever, muscle aches, headache and nausea or vomiting with a later onset of rash. If you get these symptoms after a tick bite and while living in an area where RMSF is found, RMSF should be suspected. If the disease is not treated, it can cause death. See your caregiver immediately if you get these symptoms. Do this even if not aware of a tick bite. Document Released: 01/21/2001 Document Revised: 01/01/2012 Document Reviewed: 09/13/2009 [facility] Patient Information 2013 [place], [place].

## 2013-02-26 ENCOUNTER — Encounter: Payer: Self-pay | Admitting: Family Medicine

## 2013-02-26 ENCOUNTER — Ambulatory Visit (INDEPENDENT_AMBULATORY_CARE_PROVIDER_SITE_OTHER): Payer: Medicare Other | Admitting: Family Medicine

## 2013-02-26 VITALS — BP 140/70

## 2013-02-26 DIAGNOSIS — L259 Unspecified contact dermatitis, unspecified cause: Secondary | ICD-10-CM

## 2013-02-26 NOTE — Patient Instructions (Signed)
Use cortisone cream as needed for the itching. You can also use cold washcloth on it and if you need to take Benadryl at night

## 2013-02-26 NOTE — Progress Notes (Signed)
  Subjective:    Patient ID: JAJAIRA RUIS, female    DOB: [birth year], 72 y.o.   MRN: [id number]  HPI She noted erythema and itching to the left distal forearm near the wrist today. She has no known exposure to any materials or plants. She has been taking care of her husband's belongings who recently died.   Review of Systems     Objective:   Physical Exam The ventral surface of the left distal forearm shows a very distinct erythema with distinct margins near the watch band and near where her shirt sleeve would be.       Assessment & Plan:  Contact dermatitis recommend cool compresses and cortisone cream. Also Benadryl at night as needed for itching.

## 2013-06-12 ENCOUNTER — Telehealth: Payer: Self-pay | Admitting: Family Medicine

## 2013-06-12 NOTE — Telephone Encounter (Signed)
Left message for pt to call/ needs to schedule cpe with dr. Lynelle Doctor.

## 2013-06-25 ENCOUNTER — Ambulatory Visit (INDEPENDENT_AMBULATORY_CARE_PROVIDER_SITE_OTHER): Payer: Medicare Other | Admitting: Family Medicine

## 2013-06-25 ENCOUNTER — Encounter: Payer: Self-pay | Admitting: Family Medicine

## 2013-06-25 VITALS — BP 120/70 | HR 80 | Wt 129.0 lb

## 2013-06-25 DIAGNOSIS — IMO0002 Reserved for concepts with insufficient information to code with codable children: Secondary | ICD-10-CM

## 2013-06-25 DIAGNOSIS — S60512A Abrasion of left hand, initial encounter: Secondary | ICD-10-CM

## 2013-06-25 DIAGNOSIS — W64XXXA Exposure to other animate mechanical forces, initial encounter: Secondary | ICD-10-CM

## 2013-06-25 NOTE — Progress Notes (Signed)
  Subjective:    Patient ID: Evelyn White, female    DOB: 10/14/1941, 72 y.o.   MRN: [id number]  HPI Yesterday that she was scratched by her that on the palmar surface of her left index finger. There was no history of a penetrating wound. She is here for further evaluation.   Review of Systems     Objective:   Physical Exam Linear abrasion noted to the palmar surface of the left index finger. No surrounding erythema or tenderness is noted. No evidence of puncture wound.      Assessment & Plan:  Cat scratch of hand, left, initial encounter  reassured her that since her immunizations are up to date and there was no evidence of infection, no further intervention necessary unless she sees signs of infection.

## 2013-06-25 NOTE — Patient Instructions (Signed)
Watch for signs of infection

## 2013-07-18 ENCOUNTER — Telehealth: Payer: Self-pay | Admitting: *Deleted

## 2013-07-18 NOTE — Telephone Encounter (Signed)
She needs OV. Looks like she was recently seen for acute visit for cat scratch.  It sounds like her symptoms are more longstanding.  She lost her husband a few months ago.  Schedule OV (preferably 30 mins, as her friends have also had concerns about her and her memory, and have been trying to get her in with me to discuss but she hasn't called for appt; CPE isn't until March)

## 2013-07-18 NOTE — Telephone Encounter (Signed)
Patient called in and wanted a list of her supplements, which I can give her. She also stated that she has been sleeping a lot, about 12hrs a day and still wanting more sleep during the day. Wanted to know if there was something you could give her to help to "pep her up." Wondered if I needed to call her and get her in to see you? Please advise and I will call her, thanks.

## 2013-07-18 NOTE — Telephone Encounter (Signed)
Spoke with patient and she will be in 07/21/13 @ 3:45pm.

## 2013-07-21 ENCOUNTER — Ambulatory Visit (INDEPENDENT_AMBULATORY_CARE_PROVIDER_SITE_OTHER): Payer: Medicare Other | Admitting: Family Medicine

## 2013-07-21 ENCOUNTER — Encounter: Payer: Self-pay | Admitting: Family Medicine

## 2013-07-21 VITALS — BP 110/70 | HR 72 | Ht 63.0 in | Wt 131.0 lb

## 2013-07-21 DIAGNOSIS — D509 Iron deficiency anemia, unspecified: Secondary | ICD-10-CM

## 2013-07-21 DIAGNOSIS — R5381 Other malaise: Secondary | ICD-10-CM

## 2013-07-21 DIAGNOSIS — F4321 Adjustment disorder with depressed mood: Secondary | ICD-10-CM

## 2013-07-21 DIAGNOSIS — R5383 Other fatigue: Secondary | ICD-10-CM

## 2013-07-21 LAB — CBC WITH DIFFERENTIAL/PLATELET
Eosinophils Absolute: 0.3 10*3/uL (ref 0.0–0.7)
Hemoglobin: 11.7 g/dL — ABNORMAL LOW (ref 12.0–15.0)
Lymphs Abs: 1.8 10*3/uL (ref 0.7–4.0)
MCH: 25.1 pg — ABNORMAL LOW (ref 26.0–34.0)
MCV: 77.7 fL — ABNORMAL LOW (ref 78.0–100.0)
Monocytes Relative: 9 % (ref 3–12)
Neutrophils Relative %: 58 % (ref 43–77)
RBC: 4.67 MIL/uL (ref 3.87–5.11)

## 2013-07-21 NOTE — Patient Instructions (Addendum)
We will be in touch with your results in 1-2 days. Continue grief counseling, and consider getting individual counseling as well.

## 2013-07-21 NOTE — Progress Notes (Signed)
Chief Complaint  Patient presents with  . Advice Only    excessive sleeping x since 02-25-23 when her husband passed away.    "I'm tired all the time".  She has always been a good sleeper.  She is sleeping 8-9 hours at night, and has always taken 15-30 minute afternoon nap.  But now she is waking up tired still, ready for a nap even before breakfast.  She has felt like this starting about a month after her husband died.  She thought it was normal after losing her husband at first, but it hasn't gotten any better.  There is a short time between breakfast and lunch where she feels okay.  She feels fine when she walks the dog and plays tennis, both of these activities she does daily.  Seems to be mostly when she is home alone. She has "plenty to do", denies being bored or lonely--she has "a lot to do" as far as bills, etc, but she feels like there is "an aura about her" that she just wants to lie down and sleep again.  Not feeling sad.  Occasionally cries.  Her friend [name] comes to visit most days in the afternoons. She went for a long hike with her son, and out to dinner with him twice, and she really enjoyed this. She is considering moving back to [place], to be near her grandchildren.  Her son here has been sober since going to [place], and has a girlfriend.  She probably would keep a place here to be able to visit often, if she moves. She just started grief counseling through hospice last week, and found that helpful.  She hasn't gotten any individual counseling.  When her husband was sick, she had a lot of diarrhea x 1 month.  Bowels are normal now.  No straining or bleeding.  Pt never got results from March blood tests, as she never logged into MyChart (although computer states she is active)--we were never notified that she didn't view her results. Labs then showed hemoglobin was a little lower than previously, and iron was low. She never returned stool cards that were mailed to her.  Never  started iron as she never got results with message.  She had lost more weight related to husband's illness/death and diarrhea, but she has started to regain. Denies hair/skin changes.  Appetite is good. Denies heartburn.  She gets seasonal allergies, worse earlier this month but better now (used OTC meds). Denies any current congestion or allergy symptoms.  No snoring or apnea per family. Denies restless legs Up 2x/night to go to the bathroom, unchanged Denies shortness of breath or chest pain, no DOE  Past Medical History  Diagnosis Date  . Breast cancer     h/o left breast CA-s/p lumpectomy,radiation and Tamoxifen x 5years  . GERD (gastroesophageal reflux disease)     Dr.Mann  . OA (osteoarthritis)   . Varicose veins   . Left knee pain     h/o-Dr.Collins  . BCC (basal cell carcinoma)     RLE-Dr.Lomax  . Colon polyps 5/06    hyperplastic  . Anemia 05/2009    iron deficient  . Gastric polyps 06/2009    small(Dr.Mann) EGD and Colonoscopy   . Osteoporosis     managed by Dr. Pennie Rushing  . Breast cancer 1995    left   Past Surgical History  Procedure Laterality Date  . Upper gastrointestinal endoscopy  07/05/2009    normal esophagus,GEJ and small bowel. Multiple small  vessel gastric polyps ablated with tip of snare-four removed.  . Tonsillectomy  age 51  . Breast lumpectomy  1995    left breast  . Varicose vein treatment  1990's    LLE   History   Social History  . Marital Status: Widowed    Spouse Name: N/A    Number of Children: 3  . Years of Education: N/A   Occupational History  . retired Database administrator    Social History Main Topics  . Smoking status: Former Smoker    Quit date: 10/23/1981  . Smokeless tobacco: Never Used  . Alcohol Use: Yes     Comment: 14 glasses of red wine per week. (2/day)  . Drug Use: No  . Sexual Activity: Not on file   Other Topics Concern  . Not on file   Social History Narrative   Widowed (spring 2014), lives with  2 cats, 1 dog.  Has 3 sons, one with alcoholism (did well in rehab at [facility]), 1 in [place], 1 in [place]. 2 granddaughters   Current outpatient prescriptions:acetaminophen (TYLENOL) 325 MG tablet, Take 650 mg by mouth 2 (two) times daily., Disp: , Rfl: ;  alendronate (FOSAMAX) 70 MG tablet, Take 70 mg by mouth every 7 (seven) days. , Disp: , Rfl: ;  aspirin 81 MG tablet, Take 81 mg by mouth daily., Disp: , Rfl: ;  calcium carbonate (OS-CAL) 600 MG TABS, Take 600 mg by mouth 2 (two) times daily with a meal., Disp: , Rfl:  fish oil-omega-3 fatty acids 1000 MG capsule, Take 2 g by mouth daily., Disp: , Rfl: ;  glucosamine-chondroitin 500-400 MG tablet, Take 2 tablets by mouth daily. , Disp: , Rfl: ;  Multiple Vitamins-Minerals (MULTIVITAMIN WITH MINERALS) tablet, Take 1 tablet by mouth daily., Disp: , Rfl: ;  vitamin B-12 (CYANOCOBALAMIN) 100 MCG tablet, Take 100 mcg by mouth daily., Disp: , Rfl:  vitamin E 100 UNIT capsule, Take 100 Units by mouth daily., Disp: , Rfl:   No Known Allergies  ROS:  No fevers; allergies not currently flaring, no URI symptoms, cough, shortness of breath, chest pain. No GI complaints.  Denies bleeding/bruising, rashes.  Denies feeling helpless or hopeless.  See HPI  PHYSICAL EXAM: BP 110/70  Pulse 72  Ht 5\' 3"  (1.6 m)  Wt 131 lb (59.421 kg)  BMI 23.21 kg/m2 Pleasant female, in no distress.  Talkative, showing me pictures of her grandchildren.  She is smiling; she seemed on the verge of tears during part of our conversation (when counseling about the more difficult times ahead--holidays, birthdays, etc, and also when she learned that this past weekend was the Tour to [place], as her husband used to ride that every year). Normal eye contact, speech, mood, affect, hygiene and grooming HEENT:  PERRL, EOMI, conjunctiva clear.  Nasal mucosa normal, OP without erythema, sinuses nontender Neck: no lymphadenopathy or bruit Heart: regular rate and rhythm without  murmur Lungs: clear bilaterally Abdomen: soft, nontender Extremities: no edema, 2+ pulse Skin: no bruising or rashes Neuro: alert and oriented.  Cranial nerves intact.  Normal gait  Labs from March were reviewed in detail with patient.  ASSESSMENT/PLAN:  Other malaise and fatigue - DDx reviewed in detail, including anemia, depression, sleep apnea, allergies, etc. Check labs. Suspect grief reaction, and would benefit from ongoing counseling - Plan: TSH  Iron deficiency anemia - Plan: CBC with Differential, Ferritin, Iron  Grief - encouraged ongoing grief counseling (support group) and would benefit from  individual counseling as well  If labs normal, consider sleep study, vs seeing how moods are, and observing for another 6-8 weeks  30 min visit, more than 1/2 spent counseling.

## 2013-07-22 ENCOUNTER — Encounter: Payer: Self-pay | Admitting: Family Medicine

## 2013-07-22 LAB — TSH: TSH: 1.49 u[IU]/mL (ref 0.350–4.500)

## 2013-07-22 LAB — FERRITIN: Ferritin: 13 ng/mL (ref 10–291)

## 2013-07-22 LAB — IRON: Iron: 194 ug/dL — ABNORMAL HIGH (ref 42–145)

## 2013-07-23 ENCOUNTER — Telehealth: Payer: Self-pay | Admitting: Internal Medicine

## 2013-07-23 ENCOUNTER — Encounter: Payer: Self-pay | Admitting: Internal Medicine

## 2013-07-23 NOTE — Telephone Encounter (Signed)
Pt is scheduled with Dr. Loreta Ave on Tuesday October 7,2014 @11 :15am. i will fax over notes and labs

## 2013-08-18 HISTORY — PX: UPPER GASTROINTESTINAL ENDOSCOPY: SHX188

## 2013-08-27 ENCOUNTER — Encounter: Payer: Self-pay | Admitting: *Deleted

## 2013-09-01 ENCOUNTER — Encounter: Payer: Self-pay | Admitting: Family Medicine

## 2013-11-27 ENCOUNTER — Telehealth: Payer: Self-pay | Admitting: *Deleted

## 2013-11-27 ENCOUNTER — Telehealth: Payer: Self-pay | Admitting: Family Medicine

## 2013-11-27 ENCOUNTER — Ambulatory Visit (INDEPENDENT_AMBULATORY_CARE_PROVIDER_SITE_OTHER): Payer: Medicare Other | Admitting: Family Medicine

## 2013-11-27 ENCOUNTER — Encounter: Payer: Self-pay | Admitting: Family Medicine

## 2013-11-27 VITALS — BP 120/78 | HR 72 | Ht 64.0 in | Wt 133.0 lb

## 2013-11-27 DIAGNOSIS — R413 Other amnesia: Secondary | ICD-10-CM

## 2013-11-27 NOTE — Telephone Encounter (Signed)
Pt called and stated that she took her husband's lexapro in error. She just took two tablets on two different days and that took place 2 or 3 weeks ago. This is just an [place].

## 2013-11-27 NOTE — Telephone Encounter (Signed)
Patient's son, [name](son that was here for visit today)called and asked to speak to you. He would like you to please call him back. He would not give me any information-just that it was pertaining to the visit.

## 2013-11-27 NOTE — Patient Instructions (Signed)
We are referring you to [facility] neurology for additional evaluation. We are referring you to [facility] for CT scan of the head.  Please call with the date, name of doctor and whose prescription the Lexapro, and whether or not you are taking it.

## 2013-11-27 NOTE — Telephone Encounter (Signed)
I called phone twice, no answer--left voicemail describing what happened with CT/MRI (ordered CT, didn't realize it would need prior auth; MRI is actually a better test, didn't think they would pay for that as initial screening test, but they did approve it, so I agree with doing that as eval). I referred her to [facility] Neurology--not sure if they call her to schedule consult, or if you need to schedule

## 2013-11-27 NOTE — Telephone Encounter (Signed)
If you placed order, they will schedule pt.

## 2013-11-27 NOTE — Progress Notes (Signed)
Chief Complaint  Patient presents with  . Memory Loss    has been experiencing memory loss since her husband's death last spring. Her sons are all concerned.    She presents to discuss ongoing memory concerns.  Her sons are very concerned.  One was supposed to be with her at visit--presented to office 20 minutes into the visit.   She brings in a list of her concerns, which include: Forgetfulness--names and places, sometimes slow to complete sentences.   Worried and feeling "antsy" Lost interest in cooking, and "what I do is not very good"  We reviewed our last visit, from September, where she had some similar complaints, but also complaints of significant fatigue.  She reports that she is overall much better than before Christmas, that once the holidays passed, she has been better, "attitude, mood, everything".  Still forgetting names (of a doctor, or name of a restaurant/store)--eventually remembers a few minutes later. It is embarrassing and frustrating.  Per son: She is forgetting "more than just names".  There was an episode while in [place], they were all baking cookies (including her), yet she was surprised when they came out of oven (" who made cookies?"). He notes that she has problems with problem solving, gets easily flustered. Can't cook anymore--gets very confused.  Can't get ingredients together to even cook. Makes her anxious.  She reports that she "lost interest" in cooking, especially since her husband died last Spring.  Other examples of memory problems: A bill got misplaced. Can't find checks, lots of things around the house. Can't find the lists she keeps.  Mother had problems with dementia in her 36's.  She brings in a list of meds--had lexapro and integra with a question mark--has bottles at home, but is not currently taking.  Doesn't recall who prescribed the meds, or for what. She later called and stated that the lexapro was her husbands, but that she had taken 2 pills (not  recently)  Integra might have come from Dr. Jeb Levering saw her in September and had EGD with biopsy.  Showed significant hiatal hernia, and she suspected that was the cause of her anemia.  Past Medical History  Diagnosis Date  . Breast cancer     h/o left breast CA-s/p lumpectomy,radiation and Tamoxifen x 5years  . GERD (gastroesophageal reflux disease)     Dr.Mann  . OA (osteoarthritis)   . Varicose veins   . Left knee pain     h/o-Dr.Collins  . BCC (basal cell carcinoma)     RLE-Dr.Lomax  . Colon polyps 5/06    hyperplastic  . Anemia 05/2009    iron deficient  . Gastric polyps 06/2009    small(Dr.Mann) EGD and Colonoscopy   . Osteoporosis     managed by Dr. Leo Grosser  . Breast cancer 1995    left   Past Surgical History  Procedure Laterality Date  . Upper gastrointestinal endoscopy  08/18/13    large hiatal hernia  . Tonsillectomy  age 43  . Breast lumpectomy  1995    left breast  . Varicose vein treatment  1990's    LLE   History   Social History  . Marital Status: Widowed    Spouse Name: N/A    Number of Children: 3  . Years of Education: N/A   Occupational History  . 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 Smoking status: Former Smoker    Quit date: 10/23/1981  . Smokeless  tobacco: Never Used  . Alcohol Use: Yes     Comment: 14 glasses of red wine per week. (2/day)  . Drug Use: No  . Sexual Activity: Not on file   Other Topics Concern  . Not on file   Social History Narrative   Widowed (spring 2014), lives with 2 cats, 1 dog.  Has 3 sons, one with alcoholism (did well in rehab at [facility]), 1 in [place], 1 in [place]. 2 granddaughters   Outpatient Encounter Prescriptions as of 11/27/2013  Medication Sig  . alendronate (FOSAMAX) 70 MG tablet Take 70 mg by mouth every 7 (seven) days.   Marland Kitchen aspirin 81 MG tablet Take 81 mg by mouth daily.  . Biotin 5000 MCG TABS Take 1 tablet by mouth daily.  . calcium carbonate  (OS-CAL) 600 MG TABS Take 600 mg by mouth 2 (two) times daily with a meal.  . cholecalciferol (VITAMIN D) 1000 UNITS tablet Take 1,000 Units by mouth daily.  . ferrous sulfate 325 (65 FE) MG tablet Take 325 mg by mouth daily with breakfast.  . fish oil-omega-3 fatty acids 1000 MG capsule Take 3 g by mouth daily.   Marland Kitchen glucosamine-chondroitin 500-400 MG tablet Take 3 tablets by mouth daily.   . Magnesium 500 MG CAPS Take 1 capsule by mouth daily.  . Multiple Vitamins-Minerals (MULTIVITAMIN WITH MINERALS) tablet Take 1 tablet by mouth daily.  . naproxen sodium (ANAPROX) 220 MG tablet Take 440 mg by mouth 2 (two) times daily with a meal.  . vitamin B-12 (CYANOCOBALAMIN) 100 MCG tablet Take 100 mcg by mouth daily.  . vitamin E 100 UNIT capsule Take 100 Units by mouth daily.  Marland Kitchen acetaminophen (TYLENOL) 325 MG tablet Take 650 mg by mouth 2 (two) times daily.   No Known Allergies  ROS:  Denies fevers, chills, URI symptoms, headaches, dizziness, focal neuro symptoms, chest pain, shortness of breath, GI complaints or any other concerns except as per HPI.  No numbness, tingling, tremor  PHYSICAL EXAM: BP 120/78  Pulse 72  Ht 5\' 4"  (1.626 m)  Wt 133 lb (60.328 kg)  BMI 22.82 kg/m2 Very pleasant, smiling, happy female, in no distress.  Other times she got slightly flustered, anxious, almost on verge of tears MMSE: Alert and Oriented (11/27/13, Thurs, winter) [place], [place] as [place], [place] WORLD forwards and backwards--she did this very quickly 93, 86, 79, 72, 65 --she did this extremely quickly, although on the last one, she was going to subtract 9, forgot we were subtracting 7's until corrected  Immediate recall of apple, table, penny--recalled all 3. When asked a few minutes later--only got 1/3 (said "Apple, peanut"). Upon prompting with clues,furniture-- "Chair, table, I don't remember"; coin--"Dime"  3 stage command-done correctly Normal diagram (copying picture), normal full sentence Normal clock  drawing  If she found a stamped envelope--she would put it in mailbox  Animal recall--just 8 in 30 seconds.  She said "dog, cat, bird, elephant" then got stuck, then came up with alligator, then started repeating things. Seemed to get flushed, reportedly felt hot. Son thinks she gets nervous/anxious, gives up.  Similar to what happens with cooking.  ASSESSMENT/PLAN:  Memory loss - Plan: Ambulatory referral to Neurology, MR Brain Wo Contrast, CANCELED: CT Head Wo Contrast  Had normal labs recently for similar complaints (normal B12, TSH) At this point recommend imaging--start with noncontrast CT. Refer to neuro for further evaluation. I suspect some mild memory loss, but also concomitant possible depression/anxiety.  Especially when looking at  her list of concerns, mentioning feeling worried/antsy, and reporting loss of interest in cooking--these are not complaints related to dementia.  Son expressed disatisfaction that she was told to do crossword puzzles after her last visit.  We had a long discussion of what pt tells [place], and how that might be different from what the family sees, and that their input is valued.  We discussed Mediterranean diet and keeping brain active as ways of preventing dementia/stroke.  Addendum: Couldn't get authorization for CT scan--they would approve MRI without contrast.  appt scheduled. Referred to neuro  Length of visit was 55 minutes, more than 1/2 spent counseling

## 2013-12-06 ENCOUNTER — Ambulatory Visit
Admission: RE | Admit: 2013-12-06 | Discharge: 2013-12-06 | Disposition: A | Discharge status: Home / Self Care | Payer: Medicare Other | Source: Ambulatory Visit | Attending: Family Medicine | Admitting: Family Medicine

## 2013-12-06 DIAGNOSIS — R413 Other amnesia: Secondary | ICD-10-CM

## 2013-12-10 ENCOUNTER — Encounter: Payer: Self-pay | Admitting: Neurology

## 2013-12-10 ENCOUNTER — Ambulatory Visit (INDEPENDENT_AMBULATORY_CARE_PROVIDER_SITE_OTHER): Payer: Medicare Other | Admitting: Neurology

## 2013-12-10 VITALS — BP 122/76 | HR 68 | Ht 64.0 in | Wt 136.0 lb

## 2013-12-10 DIAGNOSIS — R413 Other amnesia: Secondary | ICD-10-CM | POA: Insufficient documentation

## 2013-12-10 MED ORDER — DONEPEZIL HCL 10 MG PO TABS: ORAL_TABLET | ORAL | Status: DC

## 2013-12-10 NOTE — Progress Notes (Signed)
NEUROLOGY CONSULTATION NOTE  Evelyn White MRN: [id number] DOB: Apr 05, 1941  Referring provider: Dr. Rita White Primary care provider: Dr. Rita White  Reason for consult:  Memory loss  Dear Dr Evelyn White:  Thank you for your kind referral of Evelyn White for consultation of the above symptoms. Although her history is well known to you, please allow me to reiterate it for the purpose of our medical record. The patient was accompanied to the clinic by her son who also provides collateral information.   HISTORY OF PRESENT ILLNESS: This is a very pleasant 73 year old right-handed woman with a history of breast cancer status post lumpectomy and radiation in 1995, presenting for evaluation of worsening memory problems over the past year.  She reports that family members had noticed it before she did.  Her son reports that he and his father, who passed away in Spring 2014, had started noticing it before his death, however memory problems seemed to worsen since then.  She initially started having difficulty remembering names, now she forgets a word or where she is in a sentence.  She misplaces things at home, for instance, she could not find a bill or checks at home.  Family has noticed that she would repeat questions several times.  In the office today, her son pointed out that she asked 3 times if she needed to put her shoes back on after the exam.  Her son cites a specific instance last Christmas where the whole family baked cookies, then a few minutes later she asked who made the cookies.  She does not recall this incident.  Her son has noticed that she has some difficulties with problem solving.  She continues to drive without getting lost.  She is very active, playing tennis 5x a week, and seeing a personal trainer twice a week.  She does crossword puzzles and reads books.    She denies any headaches, dizziness, diplopia, dysarthria, dysphagia, focal numbness/tingling/weakness, bowel or bladder  dysfunction.  She denies any changes in her sense of smell, no tremors, falls, constipation.  She feels her mood is good, however does become upset and has been crying easily due to frustration with memory.  Her son feels she has always had a good disposition, no paranoia.  She lives alone with her pets, her son lives nearby, and a friend visits her nightly at home.  She continues to cook for herself, but has lost interest in this and noticed some difficulties remembering recipes.    She denies any family history of memory problems.  She used to work as a Cabin crew.  Records and images were personally reviewed where available.  I personally reviewed her MRI brain without contrast done 12/06/13, which showed moderate cerebral and cerebellar atrophy, particularly over the bilateral temporal lobes.  Mild to moderate subcortical and periventricular T2 and FLAIR hyperintensities, no acute changes.    Laboratory Data: Lab Results  Component Value Date   WBC 6.7 07/21/2013   HGB 11.7* 07/21/2013   HCT 36.3 07/21/2013   MCV 77.7* 07/21/2013   PLT 328 07/21/2013     Chemistry      Component Value Date/Time   NA 139 01/02/2013 1008   K 4.2 01/02/2013 1008   CL 105 01/02/2013 1008   CO2 27 01/02/2013 1008   BUN 15 01/02/2013 1008   CREATININE 0.74 01/02/2013 1008      Component Value Date/Time   CALCIUM 9.2 01/02/2013 1008   ALKPHOS 48 01/02/2013 1008  AST 19 01/02/2013 1008   ALT 13 01/02/2013 1008   BILITOT 0.7 01/02/2013 1008     Lab Results  Component Value Date   TSH 1.490 07/21/2013   Lab Results  Component Value Date   VITAMINB12 1164* 01/02/2013    PAST MEDICAL HISTORY: Past Medical History  Diagnosis Date  . Breast cancer     h/o left breast CA-s/p lumpectomy,radiation and Tamoxifen x 5years  . GERD (gastroesophageal reflux disease)     Dr.Mann  . OA (osteoarthritis)   . Varicose veins   . Left knee pain     h/o-Dr.Collins  . BCC (basal cell carcinoma)     RLE-Dr.Lomax  . Colon  polyps 5/06    hyperplastic  . Anemia 05/2009    iron deficient  . Gastric polyps 06/2009    small(Dr.Mann) EGD and Colonoscopy   . Osteoporosis     managed by Dr. Leo Grosser  . Breast cancer 1995    left    PAST SURGICAL HISTORY: Past Surgical History  Procedure Laterality Date  . Upper gastrointestinal endoscopy  08/18/13    large hiatal hernia  . Tonsillectomy  age 58  . Breast lumpectomy  1995    left breast  . Varicose vein treatment  1990's    LLE    MEDICATIONS: Current Outpatient Prescriptions on File Prior to Visit  Medication Sig Dispense Refill  . acetaminophen (TYLENOL) 325 MG tablet Take 650 mg by mouth 2 (two) times daily.      Marland Kitchen alendronate (FOSAMAX) 70 MG tablet Take 70 mg by mouth every 7 (seven) days.       Marland Kitchen aspirin 81 MG tablet Take 81 mg by mouth daily.      . Biotin 5000 MCG TABS Take 1 tablet by mouth daily.      . calcium carbonate (OS-CAL) 600 MG TABS Take 600 mg by mouth 2 (two) times daily with a meal.      . cholecalciferol (VITAMIN D) 1000 UNITS tablet Take 1,000 Units by mouth daily.      . ferrous sulfate 325 (65 FE) MG tablet Take 325 mg by mouth daily with breakfast.      . fish oil-omega-3 fatty acids 1000 MG capsule Take 3 g by mouth daily.       Marland Kitchen glucosamine-chondroitin 500-400 MG tablet Take 3 tablets by mouth daily.       . Magnesium 500 MG CAPS Take 1 capsule by mouth daily.      . Multiple Vitamins-Minerals (MULTIVITAMIN WITH MINERALS) tablet Take 1 tablet by mouth daily.      . naproxen sodium (ANAPROX) 220 MG tablet Take 440 mg by mouth 2 (two) times daily with a meal.      . vitamin B-12 (CYANOCOBALAMIN) 100 MCG tablet Take 100 mcg by mouth daily.      . vitamin E 100 UNIT capsule Take 100 Units by mouth daily.       No current facility-administered medications on file prior to visit.    ALLERGIES: No Known Allergies  FAMILY HISTORY: Family History  Problem Relation Age of Onset  . Hypertension Mother   . Osteoporosis Mother     . Alcohol abuse Son   . Heart disease Maternal Uncle   . Cancer Paternal Grandmother 32    breast cancer  . Diabetes Neg Hx     SOCIAL HISTORY: History   Social History  . Marital Status: Widowed    Spouse Name: N/A    Number  of Children: 3  . Years of Education: N/A   Occupational History  . 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 Smoking status: Former Smoker    Quit date: 10/23/1981  . Smokeless tobacco: Never Used  . Alcohol Use: Yes     Comment: 14 glasses of red wine per week. (2/day)  . Drug Use: No  . Sexual Activity: Not on file   Other Topics Concern  . Not on file   Social History Narrative   Widowed (spring 2014), lives with 2 cats, 1 dog.  Has 3 sons, one with alcoholism (did well in rehab at [facility]), 1 in [place], 1 in [place]. 2 granddaughters    REVIEW OF SYSTEMS: Constitutional: No fevers, chills, or sweats, no generalized fatigue, change in appetite Eyes: No visual changes, double vision, eye pain Ear, nose and throat: No hearing loss, ear pain, nasal congestion, sore throat Cardiovascular: No chest pain, palpitations Respiratory:  No shortness of breath at rest or with exertion, wheezes GastrointestinaI: No nausea, vomiting, diarrhea, abdominal pain, fecal incontinence Genitourinary:  No dysuria, urinary retention or frequency Musculoskeletal:  Occasional neck pain, back pain Integumentary: No rash, pruritus, skin lesions Neurological: as above Psychiatric: No depression, insomnia, anxiety Endocrine: No palpitations, fatigue, diaphoresis, mood swings, change in appetite, change in weight, increased thirst Hematologic/Lymphatic:  No anemia, purpura, petechiae. Allergic/Immunologic: no itchy/runny eyes, nasal congestion, recent allergic reactions, rashes  PHYSICAL EXAM: Filed Vitals:   12/10/13 1456  BP: 122/76  Pulse: 68   General: No acute distress, became anxious during MOCA testing Head:   Normocephalic/atraumatic Neck: supple, no paraspinal tenderness, full range of motion Back: No paraspinal tenderness Heart: regular rate and rhythm Lungs: Clear to auscultation bilaterally. Vascular: No carotid bruits. Skin/Extremities: No rash, no edema Neurological Exam: Mental status: alert and oriented to person, place, and year and month, did not know date, no dysarthria or dysphagia, Fund of knowledge is appropriate.  Remote memory are intact.  Attention and concentration are normal.    Able to name objects and repeat phrases. MOCA score of 23/30 (normal >/=26/30), missed all 5 objects for 5-minute recall, date, visuospatial (Trail making test B). Cranial nerves: CN I: not tested CN II: pupils equal, round and reactive to light, visual fields intact, fundi unremarkable. CN III, IV, VI:  full range of motion, no nystagmus, no ptosis CN V: facial sensation intact CN VII: upper and lower face symmetric CN VIII: hearing intact CN IX, X: gag intact, uvula midline CN XI: sternocleidomastoid and trapezius muscles intact CN XII: tongue midline Bulk & Tone: normal, no fasciculations. Motor: 5/5 throughout with no pronator drift. Sensation: intact to light touch, cold, pin, vibration and joint position sense.  No extinction to double simultaneous stimulation.  Romberg test negative Deep Tendon Reflexes: +2 throughout, no clonus Plantar responses: downgoing bilaterally Finger to nose testing: no incoordination Gait: narrow-based and steady, able to tandem walk adequately.  IMPRESSION: This is a very pleasant 73 year old right-handed woman presenting with worsening memory over the past year.  Her MOCA score is 23/30, with difficulty mainly with recall.  We discussed her brain MRI showing volume loss, particularly over the temporal lobes.  We discussed starting cholinesterase inhibitors, such as Aricept, side effects were discussed, she will start Aricept 10mg  1/2 tab daily for 1 week, then  increase to 1 tablet daily.  Expectations from the medication were discussed.  She will increase daily vitamin E supplementation to 400 IU  daily.  We also discussed vitamin D deficiency and links to dementia, a repeat vitamin D level will be ordered.  We discussed the importance of physical exercise and brain stimulation exercises, which she has been doing.  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  She will follow-up in 3 months.    Thank you for allowing me to participate in the care of this patient. Please do not hesitate to call for any questions or concerns.   Evelyn White, M.D.

## 2013-12-10 NOTE — Patient Instructions (Signed)
1. Start Aricept 10mg  tablets: 1/2 tablet daily for 1 week, then increase to 1 tablet daily 2. Increase vitamin E supplements to 400 IU daily 3. Continue exercise and brain stimulation exercises (reading, etc) 4. Follow-up in 3 months

## 2013-12-11 ENCOUNTER — Telehealth: Payer: Self-pay | Admitting: *Deleted

## 2013-12-11 ENCOUNTER — Other Ambulatory Visit: Payer: Self-pay | Admitting: *Deleted

## 2013-12-11 DIAGNOSIS — R413 Other amnesia: Secondary | ICD-10-CM

## 2013-12-11 NOTE — Telephone Encounter (Signed)
Patients son called and would like to speak to you about his mother. He would not say what is was about

## 2013-12-11 NOTE — Telephone Encounter (Signed)
Message copied by Ella Jubilee on Thu Dec 11, 2013 10:24 AM ------      Message from: Cameron Sprang      Created: Wed Dec 10, 2013  4:34 PM       Can you please order a Vitamin D 25, hydroxy level on her?  Thanks! ------

## 2013-12-11 NOTE — Telephone Encounter (Signed)
Lab ordered and spoke with patient about coming in to have it drawn. She states that she will be in to have this done in a day or two.

## 2013-12-12 ENCOUNTER — Telehealth: Payer: Self-pay | Admitting: Neurology

## 2013-12-12 ENCOUNTER — Other Ambulatory Visit: Payer: Self-pay | Admitting: Neurology

## 2013-12-12 NOTE — Telephone Encounter (Signed)
Son wanted to clarify what was going on with his mother, discussed MOCA results 23/30, mild cognitive impairment, preparation for the future with advance directives, medical power of attorney, etc. He will speak with his brothers. Start Aricept as discussed.

## 2013-12-12 NOTE — Telephone Encounter (Signed)
Spoke to son, see phone note.

## 2013-12-13 LAB — VITAMIN D 25 HYDROXY (VIT D DEFICIENCY, FRACTURES): Vit D, 25-Hydroxy: 92 ng/mL — ABNORMAL HIGH (ref 30–89)

## 2013-12-17 ENCOUNTER — Telehealth: Payer: Self-pay | Admitting: Neurology

## 2013-12-17 NOTE — Telephone Encounter (Signed)
Call about meds - Donepezil. Re: not feeling well since increasing to 1 tab per day from 1/2 tab per day. CB# [phone] / Sherri S.

## 2013-12-19 NOTE — Telephone Encounter (Signed)
Pls tell her to stay on 1/2 tablet daily for another 2 weeks, then again try to increase after. Thanks

## 2013-12-19 NOTE — Telephone Encounter (Signed)
Patient nitified

## 2013-12-19 NOTE — Telephone Encounter (Signed)
Spoke with patient she states she was tolerating the 1/2 tablet fine but when she increased to 1 while tablet it really upsets her stomach and causes her to feel sluggish for hours. She would like to know if you could recommend something else or if she can just take the 1/2 tab. Please advise

## 2013-12-19 NOTE — Telephone Encounter (Signed)
Pt has called again this morning. She is waiting to hear back from [place]. Please call.

## 2013-12-21 HISTORY — PX: CATARACT EXTRACTION, BILATERAL: SHX1313

## 2014-01-08 ENCOUNTER — Encounter: Payer: Medicare Other | Admitting: Family Medicine

## 2014-01-14 ENCOUNTER — Telehealth: Payer: Self-pay | Admitting: Family Medicine

## 2014-01-14 NOTE — Telephone Encounter (Signed)
Per my records, she is not on Evista.  She is on fosamax, and I don't believe we prescribe this for her (she likely gets from her GYN, whomever has been checking her bone density tests).  It is always best to have the pharmacy request refills (so the appropriate medication and provider is contacted)

## 2014-01-14 NOTE — Telephone Encounter (Signed)
Spoke with patient and she gets this rx from Dr.Haygood her GYN and it was denied by Dr.Haygood yesterday apparently, she was taking both evista and fosamax. I explained to her that is probably why Dr.Haygood would not refill. She will stop taking both meds and ONLY take the fosamax-states that she does have an upcoming appt with Dr.Haygood and will let her know what happened. Just an FYI.

## 2014-01-14 NOTE — Telephone Encounter (Signed)
Pt is requesting a refill on Evista sent to [facility] [address]

## 2014-02-03 LAB — HM MAMMOGRAPHY: HM Mammogram: NEGATIVE

## 2014-02-11 ENCOUNTER — Encounter: Payer: Self-pay | Admitting: Family Medicine

## 2014-04-07 ENCOUNTER — Ambulatory Visit (INDEPENDENT_AMBULATORY_CARE_PROVIDER_SITE_OTHER): Payer: Medicare Other | Admitting: Neurology

## 2014-04-07 ENCOUNTER — Encounter: Payer: Self-pay | Admitting: Neurology

## 2014-04-07 VITALS — BP 120/68 | HR 71 | Temp 97.7°F | Ht 64.0 in | Wt 135.5 lb

## 2014-04-07 DIAGNOSIS — R413 Other amnesia: Secondary | ICD-10-CM

## 2014-04-07 MED ORDER — DONEPEZIL HCL 10 MG PO TABS: ORAL_TABLET | ORAL | Status: DC

## 2014-04-07 NOTE — Progress Notes (Signed)
NEUROLOGY FOLLOW UP OFFICE NOTE  Evelyn White [id number]  HISTORY OF PRESENT ILLNESS: I had the pleasure of seeing Evelyn White in follow-up in the neurology clinic on 04/07/2014.  The patient was last seen 4 months ago for memory loss and started Aricept 10mg  daily, which she is tolerating without side effects.  She feels that her memory is a little better, for instance, she continues to play tennis and now has noticed that she does not forget scores like she used to.  She feels good and is happy to join a hospice support group for patients who have lost loved ones.  She continues to be active playing tennis and working with her Physiological scientist. She tells me her family feels her memory is better as well.  She denies getting lost driving. She denies any headache, dizziness, focal numbness/tingling/weakness.  HPI:  This is a very pleasant 73 yo RH woman with a history of breast cancer status post lumpectomy and radiation in 1995, presenting for evaluation of worsening memory problems over the past year. She reports that family members had noticed it before she did. Her son reports that he and his father, who passed away in Spring 2014, had started noticing it before his death, however memory problems seemed to worsen since then. She initially started having difficulty remembering names, now she forgets a word or where she is in a sentence. She misplaces things at home, for instance, she could not find a bill or checks at home. Family has noticed that she would repeat questions several times. In the office today, her son pointed out that she asked 3 times if she needed to put her shoes back on after the exam. Her son cites a specific instance last Christmas where the whole family baked cookies, then a few minutes later she asked who made the cookies. She does not recall this incident. Her son has noticed that she has some difficulties with problem solving. She continues to drive without getting lost.  She is very active, playing tennis 5x a week, and seeing a personal trainer twice a week. She does crossword puzzles and reads books.   Diagnostic Data: I personally reviewed her MRI brain without contrast done 12/06/13, which showed moderate cerebral and cerebellar atrophy, particularly over the bilateral temporal lobes. Mild to moderate subcortical and periventricular T2 and FLAIR hyperintensities, no acute changes.  TSH and B12 normal.  PAST MEDICAL HISTORY: Past Medical History  Diagnosis Date  . Breast cancer     h/o left breast CA-s/p lumpectomy,radiation and Tamoxifen x 5years  . GERD (gastroesophageal reflux disease)     Dr.Mann  . OA (osteoarthritis)   . Varicose veins   . Left knee pain     h/o-Dr.Collins  . BCC (basal cell carcinoma)     RLE-Dr.Lomax  . Colon polyps 5/06    hyperplastic  . Anemia 05/2009    iron deficient  . Gastric polyps 06/2009    small(Dr.Mann) EGD and Colonoscopy   . Osteoporosis     managed by Dr. Leo Grosser  . Breast cancer 1995    left    MEDICATIONS: Current Outpatient Prescriptions on File Prior to Visit  Medication Sig Dispense Refill  . acetaminophen (TYLENOL) 325 MG tablet Take 650 mg by mouth 2 (two) times daily.      Marland Kitchen alendronate (FOSAMAX) 70 MG tablet Take 70 mg by mouth every 7 (seven) days.       Marland Kitchen aspirin 81 MG tablet Take 81 mg  by mouth daily.      . Biotin 5000 MCG TABS Take 1 tablet by mouth daily.      . calcium carbonate (OS-CAL) 600 MG TABS Take 600 mg by mouth 2 (two) times daily with a meal.      . cholecalciferol (VITAMIN D) 1000 UNITS tablet Take 1,000 Units by mouth daily.      . ferrous sulfate 325 (65 FE) MG tablet Take 325 mg by mouth daily with breakfast.      . fish oil-omega-3 fatty acids 1000 MG capsule Take 3 g by mouth daily.       Marland Kitchen glucosamine-chondroitin 500-400 MG tablet Take 3 tablets by mouth daily.       . Magnesium 500 MG CAPS Take 1 capsule by mouth daily.      . Multiple Vitamins-Minerals (MULTIVITAMIN  WITH MINERALS) tablet Take 1 tablet by mouth daily.      . naproxen sodium (ANAPROX) 220 MG tablet Take 440 mg by mouth 2 (two) times daily with a meal.      . vitamin B-12 (CYANOCOBALAMIN) 100 MCG tablet Take 100 mcg by mouth daily.      . vitamin E 100 UNIT capsule Take 100 Units by mouth daily.       No current facility-administered medications on file prior to visit.    ALLERGIES: No Known Allergies  FAMILY HISTORY: Family History  Problem Relation Age of Onset  . Hypertension Mother   . Osteoporosis Mother   . Alcohol abuse Son   . Heart disease Maternal Uncle   . Cancer Paternal Grandmother 13    breast cancer  . Diabetes Neg Hx     SOCIAL HISTORY: History   Social History  . Marital Status: Widowed    Spouse Name: N/A    Number of Children: 3  . Years of Education: N/A   Occupational History  . 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 Smoking status: Former Smoker    Quit date: 10/23/1981  . Smokeless tobacco: Never Used  . Alcohol Use: Yes     Comment: 14 glasses of red wine per week. (2/day)  . Drug Use: No  . Sexual Activity: Not on file   Other Topics Concern  . Not on file   Social History Narrative   Widowed (spring 2014), lives with 2 cats, 1 dog.  Has 3 sons, one with alcoholism (did well in rehab at [facility]), 1 in [place], 1 in [place]. 2 granddaughters    REVIEW OF SYSTEMS: Constitutional: No fevers, chills, or sweats, no generalized fatigue, change in appetite Eyes: No visual changes, double vision, eye pain Ear, nose and throat: No hearing loss, ear pain, nasal congestion, sore throat Cardiovascular: No chest pain, palpitations Respiratory:  No shortness of breath at rest or with exertion, wheezes GastrointestinaI: No nausea, vomiting, diarrhea, abdominal pain, fecal incontinence Genitourinary:  No dysuria, urinary retention or frequency Musculoskeletal:  No neck pain, back pain Integumentary: No  rash, pruritus, skin lesions Neurological: as above Psychiatric: No depression, insomnia, anxiety Endocrine: No palpitations, fatigue, diaphoresis, mood swings, change in appetite, change in weight, increased thirst Hematologic/Lymphatic:  No anemia, purpura, petechiae. Allergic/Immunologic: no itchy/runny eyes, nasal congestion, recent allergic reactions, rashes  PHYSICAL EXAM: Filed Vitals:   04/07/14 1026  BP: 120/68  Pulse: 71  Temp: 97.7 F (36.5 C)   General: No acute distress Head:  Normocephalic/atraumatic Neck: supple, no paraspinal tenderness, full range of motion Heart:  Regular rate and rhythm Lungs:  Clear to auscultation bilaterally Back: No paraspinal tenderness Skin/Extremities: No rash, no edema Neurological Exam: alert and oriented to person, place, and time. No aphasia or dysarthria. Fund of knowledge is appropriate.  Recent and remote memory are intact.  Attention and concentration are normal.    Able to name objects and repeat phrases. Cranial nerves: Pupils equal, round, reactive to light.  Fundoscopic exam unremarkable, no papilledema. Extraocular movements intact with no nystagmus. Visual fields full. Facial sensation intact. No facial asymmetry. Tongue, uvula, palate midline.  Motor: Bulk and tone normal, muscle strength 5/5 throughout with no pronator drift.  Sensation to light touch, temperature and vibration intact.  No extinction to double simultaneous stimulation.  Deep tendon reflexes 2+ throughout, toes downgoing.  Finger to nose testing intact.  Gait narrow-based and steady, able to tandem walk adequately.  Romberg negative.  IMPRESSION: This is a very pleasant 73 yo RH woman with worsening memory over the past year. Her MOCA score on initial visit was 23/30, with difficulty mainly with recall, consistent with mild cognitive impairment.  She is feeling better on Aricept 10mg  daily, continue current dose.  We again discussed the importance of physical exercise  and brain stimulation exercises for brain health.  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  Follow-up in 6 months.  Thank you for allowing me to participate in her care.  Please do not hesitate to call for any questions or concerns.  The duration of this appointment visit was 15 minutes of face-to-face time with the patient.  Greater than 50% of this time was spent in counseling, explanation of diagnosis, planning of further management, and coordination of care.   Ellouise Newer, M.D.   CC: Dr. Rita Ohara

## 2014-04-07 NOTE — Patient Instructions (Signed)
1. Continue Aricept 10mg  daily 2. Continue physical and brain stimulation exercises 3. Follow-up in 6 months

## 2014-04-23 ENCOUNTER — Encounter: Payer: Self-pay | Admitting: Family Medicine

## 2014-04-23 ENCOUNTER — Ambulatory Visit (INDEPENDENT_AMBULATORY_CARE_PROVIDER_SITE_OTHER): Payer: Medicare Other | Admitting: Family Medicine

## 2014-04-23 VITALS — BP 142/80 | HR 68 | Ht 64.0 in | Wt 134.0 lb

## 2014-04-23 DIAGNOSIS — E7800 Pure hypercholesterolemia, unspecified: Secondary | ICD-10-CM

## 2014-04-23 DIAGNOSIS — R413 Other amnesia: Secondary | ICD-10-CM

## 2014-04-23 DIAGNOSIS — Z23 Encounter for immunization: Secondary | ICD-10-CM

## 2014-04-23 DIAGNOSIS — M810 Age-related osteoporosis without current pathological fracture: Secondary | ICD-10-CM

## 2014-04-23 DIAGNOSIS — Z0000 Encounter for general adult medical examination without abnormal findings: Secondary | ICD-10-CM

## 2014-04-23 DIAGNOSIS — D509 Iron deficiency anemia, unspecified: Secondary | ICD-10-CM

## 2014-04-23 DIAGNOSIS — Z79899 Other long term (current) drug therapy: Secondary | ICD-10-CM

## 2014-04-23 LAB — CBC WITH DIFFERENTIAL/PLATELET
Basophils Absolute: 0.1 10*3/uL (ref 0.0–0.1)
Basophils Relative: 1 % (ref 0–1)
EOS ABS: 0.1 10*3/uL (ref 0.0–0.7)
EOS PCT: 1 % (ref 0–5)
HCT: 44.7 % (ref 36.0–46.0)
HEMOGLOBIN: 15.3 g/dL — AB (ref 12.0–15.0)
LYMPHS ABS: 1.5 10*3/uL (ref 0.7–4.0)
Lymphocytes Relative: 27 % (ref 12–46)
MCH: 30.4 pg (ref 26.0–34.0)
MCHC: 34.2 g/dL (ref 30.0–36.0)
MCV: 88.9 fL (ref 78.0–100.0)
MONOS PCT: 11 % (ref 3–12)
Monocytes Absolute: 0.6 10*3/uL (ref 0.1–1.0)
Neutro Abs: 3.2 10*3/uL (ref 1.7–7.7)
Neutrophils Relative %: 60 % (ref 43–77)
Platelets: 211 10*3/uL (ref 150–400)
RBC: 5.03 MIL/uL (ref 3.87–5.11)
RDW: 13.5 % (ref 11.5–15.5)
WBC: 5.4 10*3/uL (ref 4.0–10.5)

## 2014-04-23 LAB — POCT URINALYSIS DIPSTICK
BILIRUBIN UA: NEGATIVE
Blood, UA: NEGATIVE
Glucose, UA: NEGATIVE
Ketones, UA: NEGATIVE
Nitrite, UA: NEGATIVE
PH UA: 7
Protein, UA: NEGATIVE
Spec Grav, UA: <=1.005
Urobilinogen, UA: NEGATIVE

## 2014-04-23 NOTE — Progress Notes (Signed)
Chief Complaint  Patient presents with  . Annual Exam    fasting annual exam no pap-sees GYN. UA showed trace leuks, no symptoms. No concerns. Has [employer] form on chart. Depression screening-1. (all meds reconciled)   Evelyn White is a 73 y.o. female who presents for a complete physical.  She has the following concerns:  "This is the year that I'm crying"--when she tasted a dish that her son made, that her husband used to make, tasted just like Bill's, she started crying.  Another time, when attending a live music event, a song came on that was "old timey dancing music" that she used to dance with her husband to--also made her get teary.  She is getting grief counseling (group) through Hosp[facility]. She is not feeling hopeless or helpless. She is active in a book club, still playing tennis.  Just has moments that make her cry.  She is not feeling lonely or sad, just mostly with memories she gets teary.  Memory Loss--she has been seeing neurologist, and was started on Aricept.  She last saw neuro 2 weeks ago. She feels that the medication has been somewhat helpful, and that family also notices improvement.  She is remembering names better (and if not immediate recall, remembers later that day).  She is keeping lists now ,which is also helping stay more organized, be less forgetful.  She brings in a list of concerns to discuss today.  Concerns:  Nails are splitting, break easily.  Started a couple of years ago, felt like the gel nails that she used for a few years made it worse, so stopped using them over 1.5 years ago. She just started using something topical from the salon that is supposed to help strengthen her [facility] (1 is like a clear nail polish, to be used at the tip, and a gel-like substance used at the base of the nail.)  Asking about a foot doctor--"they are a mess".  Mostly related to callouses, sometimes has discomfort over the 1st MTP's.  Moving her lips around when she is worried, just like  her mother did.  Some grinding of her teeth.  Just recent.  Plans to see dentist soon.  Her handwriting has changed, signature looks different, messier in general.  Denies tremor. Denies trouble gripping pencil.  Sleep pattern has changed.  She is getting up twice at night to go to the bathroom. She can usually fall back to sleep quickly, but not always.  She takes melatonin if she has trouble getting back to sleep.  Sometimes she is still tired when she wakes up.  Immunization History  Administered Date(s) Administered  . Influenza Split 07/18/2012  . Influenza, High Dose Seasonal PF 07/04/2013  . Pneumococcal Polysaccharide-23 01/22/2008  . Td 07/20/2004  . Tdap 04/26/2012  . Zoster 06/02/2009   Last Pap smear: Through Dr. Leo Grosser, UTD  Last mammogram: 01/2014 Last colonoscopy: 06/2009, Dr. Collene Mares Last DEXA: per Dr. Leo Grosser.  Last year she mentioned possibly starting Prolia injections, but never did, continues on alendronate.  Dentist: twice yearly Ophtho: yearly, had cataract surgery in March Exercise: tennis 5x/week; personal trainer 2x/week (weights and cardio, and balance work)  Has living will and healthcare power of attorney--thinks it may have her husband's name on it  Past Medical History  Diagnosis Date  . Breast cancer 1995    h/o left breast CA-s/p lumpectomy,radiation and Tamoxifen x 5years  . GERD (gastroesophageal reflux disease)     Dr.Mann  . OA (osteoarthritis)  hands,feet  . Varicose veins   . Left knee pain     h/o-Dr.Collins  . BCC (basal cell carcinoma)     RLE-Dr.Lomax  . Colon polyps 5/06    hyperplastic  . Anemia 05/2009    iron deficient  . Gastric polyps 06/2009    small(Dr.Mann) EGD and Colonoscopy   . Osteoporosis     managed by Dr. Leo Grosser  . Breast cancer 1995    left    Past Surgical History  Procedure Laterality Date  . Upper gastrointestinal endoscopy  08/18/13    large hiatal hernia  . Tonsillectomy  age 86  . Breast lumpectomy   1995    left breast  . Varicose vein treatment  1990's    LLE  . Cataract extraction, bilateral  12/2013    Dr. Luretha Rued    History   Social History  . Marital Status: Widowed    Spouse Name: N/A    Number of Children: 3  . Years of Education: N/A   Occupational History  . 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 Smoking status: Former Smoker    Quit date: 10/23/1981  . Smokeless tobacco: Never Used  . Alcohol Use: Yes     Comment: 1.5 glasses of red wine daily  . Drug Use: No  . Sexual Activity: Not on file   Other Topics Concern  . Not on file   Social History Narrative   Widowed (spring 2014), lives with 2 cats, 1 dog.  Has 3 sons, one with alcoholism (did well in rehab at [facility]), 1 in [place], 1 in [place]. 2 granddaughters    Family History  Problem Relation Age of Onset  . Hypertension Mother   . Osteoporosis Mother   . Alcohol abuse Son   . Heart disease Maternal Uncle   . Cancer Paternal Grandmother 66    breast cancer  . Diabetes Neg Hx    Outpatient Encounter Prescriptions as of 04/23/2014  Medication Sig Note  . acetaminophen (TYLENOL) 325 MG tablet Take 650 mg by mouth 2 (two) times daily. 04/23/2014: Uses it once daily  . alendronate (FOSAMAX) 70 MG tablet Take 70 mg by mouth every 7 (seven) days.  04/23/2014: From GYN  . aspirin 81 MG tablet Take 81 mg by mouth daily.   . Biotin 5000 MCG TABS Take 1 tablet by mouth daily.   . calcium carbonate (OS-CAL) 600 MG TABS Take 600 mg by mouth 2 (two) times daily with a meal.   . cholecalciferol (VITAMIN D) 1000 UNITS tablet Take 1,000 Units by mouth daily.   Marland Kitchen donepezil (ARICEPT) 10 MG tablet Take 10 mg by mouth at bedtime.   . ferrous sulfate 325 (65 FE) MG tablet Take 325 mg by mouth daily with breakfast.   . fish oil-omega-3 fatty acids 1000 MG capsule Take 2 g by mouth daily.    Marland Kitchen glucosamine-chondroitin 500-400 MG tablet Take 3 tablets by mouth daily.    .  Multiple Vitamins-Minerals (MULTIVITAMIN WITH MINERALS) tablet Take 1 tablet by mouth daily.   . naproxen sodium (ANAPROX) 220 MG tablet Take 440 mg by mouth 2 (two) times daily with a meal. 04/23/2014: Uses 1 tablet, just as needed, not daily  . omeprazole (PRILOSEC) 40 MG capsule  04/23/2014: From Dr. Collene Mares  . vitamin B-12 (CYANOCOBALAMIN) 100 MCG tablet Take 100 mcg by mouth daily.   . vitamin E 100 UNIT capsule Take 100 Units by  mouth daily.   . [DISCONTINUED] donepezil (ARICEPT) 10 MG tablet Take 1/2 tablet daily for one week then increase to 1 tab daily   . [DISCONTINUED] Magnesium 500 MG CAPS Take 1 capsule by mouth daily.     No Known Allergies  ROS: The patient denies anorexia, fever, weight changes, headaches, vision changes, decreased hearing, ear pain, sore throat, breast concerns, chest pain, palpitations, dizziness, syncope, dyspnea on exertion, cough, swelling, nausea, vomiting, diarrhea, constipation, abdominal pain, melena, hematochezia, indigestion/heartburn (rare), hematuria, incontinence, dysuria, vaginal bleeding, discharge, odor or itch, genital lesions, joint pains, numbness, tingling, weakness, tremor, suspicious skin lesions, anxiety, abnormal bleeding/bruising, or enlarged lymph nodes.  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  Unchanged. Felt to be fibroma at last year's visit. She also saw dermatologist--pt states they "didn't see anything"  PHYSICAL EXAM:  BP 142/80  Pulse 68  Ht 5\' 4"  (1.626 m)  Wt 134 lb (60.782 kg)  BMI 22.99 kg/m2   General Appearance:  Alert, cooperative, no distress, appears stated age   Head:  Normocephalic, without obvious abnormality, atraumatic   Eyes:  PERRL, conjunctiva/corneas clear, EOM's intact, fundi  benign   Ears:  Normal TM's and external ear canals   Nose:  Nares normal, mucosa normal, no drainage or sinus tenderness   Throat:  Lips, mucosa, and tongue  normal; teeth and gums normal   Neck:  Supple, no lymphadenopathy; thyroid: no enlargement/tenderness/nodules; no carotid  bruit or JVD   Back:  Spine nontender, no curvature, ROM normal, no CVA tenderness. Soft tissue swelling in mid-upper thoracic back, slightly left of midline, measures 4x4 cm. No pore seen to suspect cyst, so likely lipoma.  Nontender, no induration or erythema  Lungs:  Clear to auscultation bilaterally without wheezes, rales or ronchi; respirations unlabored   Chest Wall:  No tenderness or deformity   Heart:  Regular rate and rhythm, S1 and S2 normal, no murmur, rub  or gallop   Breast Exam:  Deferred to GYN   Abdomen:  Soft, non-tender, nondistended, normoactive bowel sounds,  no masses, no hepatosplenomegaly   Genitalia:  Deferred to GYN      Extremities:  No clubbing, cyanosis or edema   Pulses:  2+ and symmetric all extremities   Skin:  Skin color, texture, turgor normal, no rashes or lesions (just lipoma on upper back as above).  Thick callouses on toes.  Nails are polished, polish is coming off the distal nails, somewhat thinner and more brittle at tips. No thickening.  Cannot tell if there is any discoloration due to polish  Lymph nodes:  Cervical, supraclavicular, and axillary nodes normal   Neurologic:  CNII-XII intact, normal strength, sensation and gait; reflexes 2+ and symmetric throughout          Psych: Normal mood, affect, hygiene and grooming.   ASSESSMENT/PLAN:  Routine general medical examination at a health care facility - Plan: POCT Urinalysis Dipstick, Visual acuity screening  Memory loss - improved on Aricept  Osteoporosis, unspecified - monitored per Dr. Louie Boston my records, she may be due soon for repeat DEXA  Iron deficiency anemia - ? if still needs to be taking iron daily--ensure not causing harm; check labs.  no known blood losses (poss GI and poor iron absorption from PPI) - Plan: CBC with Differential, Ferritin, Iron  Encounter for  long-term (current) use of other medications - Plan: Comprehensive metabolic panel, CBC with Differential, Ferritin, Iron,  Magnesium  Pure hypercholesterolemia - Plan: Lipid panel  Need for prophylactic vaccination against Streptococcus pneumoniae (pneumococcus) - Plan: Pneumococcal conjugate vaccine 13-valent  Discussed monthly self breast exams and yearly mammograms; at least 30 minutes of aerobic activity at least 5 days/week, weight-bearing exercise; proper sunscreen use reviewed; healthy diet, including goals of calcium and vitamin D intake and alcohol recommendations (less than or equal to 1 drink/day) reviewed; regular seatbelt use; changing batteries in smoke detectors. Immunization recommendations discussed--yearly high dose flu shot, Prevnar-13 today. Colonoscopy recommendations reviewed, UTD.   Callouses--rec Dr. Su Hoff (previous negative experience at [facility])  New paperwork given for Living Will and healthcare POA for her to discuss and fill out with her sons  Full code, full care  Limit fluids in the evenings to see if that helps with sleep issue (sleep is interrupted by bathroom trips).  Vitamin D in 11/2013 was high.  Not sure what you were taking at that time, but I would have recommended cutting back the dose slightly.  If your medications were exactly the same then as they are now, I would stop the additional Vitamin D, and just continue with the calcium with D and the multivitamin

## 2014-04-23 NOTE — Patient Instructions (Addendum)
  HEALTH MAINTENANCE RECOMMENDATIONS:  It is recommended that you get at least 30 minutes of aerobic exercise at least 5 days/week (for weight loss, you may need as much as 60-90 minutes). This can be any activity that gets your heart rate up. This can be divided in 10-15 minute intervals if needed, but try and build up your endurance at least once a week.  Weight bearing exercise is also recommended twice weekly.  Eat a healthy diet with lots of vegetables, fruits and fiber.  "Colorful" foods have a lot of vitamins (ie green vegetables, tomatoes, red peppers, etc).  Limit sweet tea, regular sodas and alcoholic beverages, all of which has a lot of calories and sugar.  Up to 1 alcoholic drink daily may be beneficial for women (unless trying to lose weight, watch sugars).  Drink a lot of water.  Calcium recommendations are 1200-1500 mg daily (1500 mg for postmenopausal women or women without ovaries), and vitamin D 1000 IU daily.  This should be obtained from diet and/or supplements (vitamins), and calcium should not be taken all at once, but in divided doses.  Monthly self breast exams and yearly mammograms for women over the age of 57 is recommended.  Sunscreen of at least SPF 30 should be used on all sun-exposed parts of the skin when outside between the hours of 10 am and 4 pm (not just when at beach or pool, but even with exercise, golf, tennis, and yard work!)  Use a sunscreen that says "broad spectrum" so it covers both UVA and UVB rays, and make sure to reapply every 1-2 hours.  Remember to change the batteries in your smoke detectors when changing your clock times in the spring and fall.  Use your seat belt every time you are in a car, and please drive safely and not be distracted with cell phones and texting while driving.  Limit fluids in the evenings to see if that helps with sleep issue (getting up less often to use the bathroom, which interferes with your sleep).  Vitamin D in 11/2013  was high.  Not sure what you were taking at that time, but I would have recommended cutting back the dose slightly.  If your medications were exactly the same then as they are now, I would stop the additional Vitamin D, and just continue with the calcium with D and the multivitamin  Get your high dose flu shot in the Fall (Sept/October)

## 2014-04-24 ENCOUNTER — Encounter: Payer: Self-pay | Admitting: Family Medicine

## 2014-04-24 LAB — IRON: Iron: 134 ug/dL (ref 42–145)

## 2014-04-24 LAB — COMPREHENSIVE METABOLIC PANEL
ALK PHOS: 52 U/L (ref 39–117)
ALT: 16 U/L (ref 0–35)
AST: 22 U/L (ref 0–37)
Albumin: 4.6 g/dL (ref 3.5–5.2)
BILIRUBIN TOTAL: 1.1 mg/dL (ref 0.2–1.2)
BUN: 14 mg/dL (ref 6–23)
CO2: 29 mEq/L (ref 19–32)
Calcium: 9.6 mg/dL (ref 8.4–10.5)
Chloride: 101 mEq/L (ref 96–112)
Creat: 0.71 mg/dL (ref 0.50–1.10)
GLUCOSE: 89 mg/dL (ref 70–99)
Potassium: 4.7 mEq/L (ref 3.5–5.3)
Sodium: 139 mEq/L (ref 135–145)
Total Protein: 6.8 g/dL (ref 6.0–8.3)

## 2014-04-24 LAB — FERRITIN: FERRITIN: 17 ng/mL (ref 10–291)

## 2014-04-24 LAB — LIPID PANEL
CHOL/HDL RATIO: 2.6 ratio
CHOLESTEROL: 233 mg/dL — AB (ref 0–200)
HDL: 88 mg/dL (ref >39)
LDL Cholesterol: 128 mg/dL — ABNORMAL HIGH (ref 0–99)
Triglycerides: 84 mg/dL (ref <150)
VLDL: 17 mg/dL (ref 0–40)

## 2014-04-24 LAB — MAGNESIUM: Magnesium: 2.2 mg/dL (ref 1.5–2.5)

## 2014-08-13 ENCOUNTER — Ambulatory Visit (INDEPENDENT_AMBULATORY_CARE_PROVIDER_SITE_OTHER): Payer: Medicare Other | Admitting: Family Medicine

## 2014-08-13 ENCOUNTER — Encounter: Payer: Self-pay | Admitting: Family Medicine

## 2014-08-13 VITALS — BP 130/72 | HR 80 | Ht 64.0 in | Wt 135.0 lb

## 2014-08-13 DIAGNOSIS — M79672 Pain in left foot: Secondary | ICD-10-CM

## 2014-08-13 DIAGNOSIS — L299 Pruritus, unspecified: Secondary | ICD-10-CM

## 2014-08-13 DIAGNOSIS — G4700 Insomnia, unspecified: Secondary | ICD-10-CM

## 2014-08-13 NOTE — Progress Notes (Signed)
Chief Complaint  Patient presents with  . Advice Only    right arm itching x 1 week that occurs mainly at night. Left ankle pain x 2 weeks. Neck pain, has been bothering her and causing her to wake up unrested.     5 days ago her right shoulder/upper arm started itching in the middle of the night.  She wasn't able to sleep, almost wanted to cry. She found some sort of cortisone cream (OTC) at home, which helped.  It is interfering with her sleep, very uncomfortable at night.  Doesn't seem to bother her much during the day. Describes it as itching, needing to rub the area, trying not to scratch.  Denies any new soaps, detergents, new contacts of any kind.  There is no visible rash or lesions, insect bites.  Denies numbness/tingling.  Flu shot was likely in the other arm, and was well over a month ago.  10 days ago she noticed pain on the lateral aspect of her left foot.  She finds herself limping.  It hurts to play tennis (but hasn't stopped her).  She has new and old shoes--feels a little better in the newer shoe (got them 2-3 months ago). No known fall or injury. Denies any swelling, redness or known trauma.  She has always been a good sleeper.  Even prior to the itching on her arm starting, she has been having trouble sleeping.  Trouble over the last 2-3 months or so.  She has no trouble falling asleep, but will wake up in the middle of the night (1-3 am) and is unable to get back to sleep.  She has tried taking an OTC med (can't recall name), which seems to sometimes help. Husband died fairly suddenly 04-Feb-2013 (short illness).  She did fine the first year, but in the last 4-5 months (around the anniversary of his death) she has felt more sad, crying frequently (not daily).  She did hospice grief counseling a couple of times (once was initially, and then once the following year).  She finds herself crying more frequently recently. She is active playing tennis 5x/week, going to book club. 1 diet  coke/day, 1 glass of wine with dinner, 1/2 glass in the evening. She always naps, about 15-25 minutes, although she hasn't been able to nap since she got the puppy. She is up 2-3 times/night to go to the bathroom (new for her).  She has been drinking additional water after her wine more recently.  Got a new [place] spaniel puppy, now about 46 months old.  Past Medical History  Diagnosis Date  . Breast cancer 1994-02-04    h/o left breast CA-s/p lumpectomy,radiation and Tamoxifen x 5years  . GERD (gastroesophageal reflux disease)     Dr.Mann  . OA (osteoarthritis)     hands,feet  . Varicose veins   . Left knee pain     h/o-Dr.Collins  . BCC (basal cell carcinoma)     RLE-Dr.Lomax  . Colon polyps 5/06    hyperplastic  . Anemia 05/2009    iron deficient  . Gastric polyps 06/2009    small(Dr.Mann) EGD and Colonoscopy   . Osteoporosis     managed by Dr. Leo Grosser  . Breast cancer 1995    left   Past Surgical History  Procedure Laterality Date  . Upper gastrointestinal endoscopy  08/18/13    large hiatal hernia  . Tonsillectomy  age 36  . Breast lumpectomy  1995    left breast  . Varicose  vein treatment  1990's    LLE  . Cataract extraction, bilateral  12/2013    Dr. Luretha Rued   History   Social History  . Marital Status: Widowed    Spouse Name: N/A    Number of Children: 3  . Years of Education: N/A   Occupational History  . 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 Smoking status: Former Smoker    Quit date: 10/23/1981  . Smokeless tobacco: Never Used  . Alcohol Use: Yes     Comment: 1.5 glasses of red wine daily  . Drug Use: No  . Sexual Activity: Not on file   Other Topics Concern  . Not on file   Social History Narrative   Widowed (spring 2014), lives with 2 cats, 2 dogs.  Has 3 sons, one with alcoholism (did well in rehab at [facility]), 1 in [place], 1 in [place]. 2 granddaughters   Outpatient Encounter Prescriptions as of  08/13/2014  Medication Sig  . acetaminophen (TYLENOL) 325 MG tablet Take 650 mg by mouth 2 (two) times daily.  Marland Kitchen alendronate (FOSAMAX) 70 MG tablet Take 70 mg by mouth every 7 (seven) days.   Marland Kitchen aspirin 81 MG tablet Take 81 mg by mouth daily.  . Biotin 5000 MCG TABS Take 1 tablet by mouth daily.  . calcium carbonate (OS-CAL) 600 MG TABS Take 600 mg by mouth 2 (two) times daily with a meal.  . cholecalciferol (VITAMIN D) 1000 UNITS tablet Take 1,000 Units by mouth daily.  Marland Kitchen donepezil (ARICEPT) 10 MG tablet Take 10 mg by mouth at bedtime.  . ferrous sulfate 325 (65 FE) MG tablet Take 325 mg by mouth daily with breakfast.  . fish oil-omega-3 fatty acids 1000 MG capsule Take 2 g by mouth daily.   Marland Kitchen glucosamine-chondroitin 500-400 MG tablet Take 3 tablets by mouth daily.   . Multiple Vitamins-Minerals (MULTIVITAMIN WITH MINERALS) tablet Take 1 tablet by mouth daily.  Marland Kitchen omeprazole (PRILOSEC) 40 MG capsule   . vitamin B-12 (CYANOCOBALAMIN) 100 MCG tablet Take 100 mcg by mouth daily.  . vitamin E 100 UNIT capsule Take 100 Units by mouth daily.  Marland Kitchen FLUVIRIN SUSP   . naproxen sodium (ANAPROX) 220 MG tablet Take 440 mg by mouth 2 (two) times daily with a meal.   No Known Allergies  ROS:  Nose has been running since last Spring, clear mucus. No sinus pain, headaches, sore throat, cough.  No fevers, chills. No dysuria.  No rashes, bleeding, bruising.  +crying, insomnia, foot pain and arm itching as per HPI.  PHYSICAL EXAM: BP 130/72  Pulse 80  Ht 5\' 4"  (1.626 m)  Wt 135 lb (61.236 kg)  BMI 23.16 kg/m2 Well developed, pleasant female, in no distress.  Some trouble remembering details for history (which arm she got flu shot in, other minor details.  Right upper arm--area of discomfort is around her deltoid.  There are two very small excoriations (likely a consequence of scratching, not the cause of the itching, which is more widespread).  No other visible skin abnormality noted.  Area of discomfort  is lateral left foot.  There is no erythema, warmth, swelling or bruising. She is mildly tender along the bottom of the 5th metatarsal. She points to area of discomfort as lateral 5th metatarsal.  2+ pulses.  No edema Psych: alert and oriented.  Full range of affect--happy and smiling, and intermittently teary/crying.  Overall normal mood.  Normal hygiene  and grooming  ASSESSMENT/PLAN:  Left foot pain - Plan: DG Foot Complete Left  Itching  Insomnia   Pruritis of right upper arm, without any visible cause. Will treat symptomatically.  Left lateral foot pain--check x-ray to r/o stress fracture.  Discussed proper shoe-wear.  Insomnia:  Sleep hygiene reviewed at length.  Increased fluid intake and bathroom trips may be contributing.  May have component of depression. Encouraged her to f/u with Hospice for additional counseling.    Try using benadryl at bedtime (this might you sleep, as well as help with the itching).  It is fine to continue with a 1% hydrocortisone cream, up to 2-3 times daily, if needed. You can also use topical anthistamines such as calamine/caladryl lotion. Keep your skin well moisturized.  Return if you develop a rash/lesions, or spread of symptoms, or any new developments for re-evaluation.  I don't see anything of concern today, and can't figure out the actual cause (but we can treat the symptoms).  Go to [facility] ([address] to get x-ray of your left foot.  You can go between 8:30 and 4, at your convenience, without appointment. Try and wear supportive shoes (avoid hard soled sandals, flip-flops).  Have good arch support in the shoe.  Avoid wearing narrow shoes.   Continue with the newer tennis shoe, getting rid of the much older shoe.  Call Hospice to get back with counseling.  Depression may be contributing.  Cut back on the fluids in the evening, as the need to go to the bathroom may also be contributing to interference with  sleep.  We can consider sleep medication (vs anti-depressants) if things aren't improving.  Follow up to discuss if persistent/worsening problems (either with sleep or moods).

## 2014-08-13 NOTE — Patient Instructions (Addendum)
Try using benadryl at bedtime (this might you sleep, as well as help with the itching).  It is fine to continue with a 1% hydrocortisone cream, up to 2-3 times daily, if needed. You can also use topical anthistamines such as calamine/caladryl lotion. Keep your skin well moisturized.  Return if you develop a rash/lesions, or spread of symptoms, or any new developments for re-evaluation.  I don't see anything of concern today, and can't figure out the actual cause (but we can treat the symptoms).   Go to [facility] ([address] or [address] to get x-ray of your left foot.  You can go between 8:30 and 4, at your convenience, without appointment. Try and wear supportive shoes (avoid hard soled sandals, flip-flops).  Have good arch support in the shoe.  Avoid wearing narrow shoes.   Continue with the newer tennis shoe, getting rid of the much older shoe.  Insomnia Insomnia is frequent trouble falling and/or staying asleep. Insomnia can be a long term problem or a short term problem. Both are common. Insomnia can be a short term problem when the wakefulness is related to a certain stress or worry. Long term insomnia is often related to ongoing stress during waking hours and/or poor sleeping habits. Overtime, sleep deprivation itself can make the problem worse. Every little thing feels more severe because you are overtired and your ability to cope is decreased. CAUSES   Stress, anxiety, and depression.  Poor sleeping habits.  Distractions such as TV in the bedroom.  Naps close to bedtime.  Engaging in emotionally charged conversations before bed.  Technical reading before sleep.  Alcohol and other sedatives. They may make the problem worse. They can hurt normal sleep patterns and normal dream activity.  Stimulants such as caffeine for several hours prior to bedtime.  Pain syndromes and shortness of breath can cause insomnia.  Exercise late at night.  Changing time zones may cause  sleeping problems (jet lag). It is sometimes helpful to have someone observe your sleeping patterns. They should look for periods of not breathing during the night (sleep apnea). They should also look to see how long those periods last. If you live alone or observers are uncertain, you can also be observed at a sleep clinic where your sleep patterns will be professionally monitored. Sleep apnea requires a checkup and treatment. Give your caregivers your medical history. Give your caregivers observations your family has made about your sleep.  SYMPTOMS   Not feeling rested in the morning.  Anxiety and restlessness at bedtime.  Difficulty falling and staying asleep. TREATMENT   Your caregiver may prescribe treatment for an underlying medical disorders. Your caregiver can give advice or help if you are using alcohol or other drugs for self-medication. Treatment of underlying problems will usually eliminate insomnia problems.  Medications can be prescribed for short time use. They are generally not recommended for lengthy use.  Over-the-counter sleep medicines are not recommended for lengthy use. They can be habit forming.  You can promote easier sleeping by making lifestyle changes such as:  Using relaxation techniques that help with breathing and reduce muscle tension.  Exercising earlier in the day.  Changing your diet and the time of your last meal. No night time snacks.  Establish a regular time to go to bed.  Counseling can help with stressful problems and worry.  Soothing music and white noise may be helpful if there are background noises you cannot remove.  Stop tedious detailed work at least  one hour before bedtime. HOME CARE INSTRUCTIONS   Keep a diary. Inform your caregiver about your progress. This includes any medication side effects. See your caregiver regularly. Take note of:  Times when you are asleep.  Times when you are awake during the night.  The quality of  your sleep.  How you feel the next day. This information will help your caregiver care for you.  Get out of bed if you are still awake after 15 minutes. Read or do some quiet activity. Keep the lights down. Wait until you feel sleepy and go back to bed.  Keep regular sleeping and waking hours. Avoid naps.  Exercise regularly.  Avoid distractions at bedtime. Distractions include watching television or engaging in any intense or detailed activity like attempting to balance the household checkbook.  Develop a bedtime ritual. Keep a familiar routine of bathing, brushing your teeth, climbing into bed at the same time each night, listening to soothing music. Routines increase the success of falling to sleep faster.  Use relaxation techniques. This can be using breathing and muscle tension release routines. It can also include visualizing peaceful scenes. You can also help control troubling or intruding thoughts by keeping your mind occupied with boring or repetitive thoughts like the old concept of counting sheep. You can make it more creative like imagining planting one beautiful flower after another in your backyard garden.  During your day, work to eliminate stress. When this is not possible use some of the previous suggestions to help reduce the anxiety that accompanies stressful situations. MAKE SURE YOU:   Understand these instructions.  Will watch your condition.  Will get help right away if you are not doing well or get worse. Document Released: 10/06/2000 Document Revised: 01/01/2012 Document Reviewed: 11/06/2007 [facility] Patient Information 2015 [place], [place]. This information is not intended to replace advice given to you by your health care provider. Make sure you discuss any questions you have with your health care provider.  I don't know what your medication is that you are taking for sleep--it very likely contains diphenhydramine (which is Benadryl) so make sure not to take  both together.  Call Hospice to get back with counseling.  Depression may be contributing.  Cut back on the fluids in the evening, as the need to go to the bathroom may also be contributing to interference with sleep.  We can consider sleep medication (vs anti-depressants) if things aren't improving.  Follow up to discuss if persistent/worsening problems (either with sleep or moods).

## 2014-09-28 ENCOUNTER — Ambulatory Visit (INDEPENDENT_AMBULATORY_CARE_PROVIDER_SITE_OTHER): Payer: Medicare Other | Admitting: Family Medicine

## 2014-09-28 ENCOUNTER — Encounter: Payer: Self-pay | Admitting: Family Medicine

## 2014-09-28 VITALS — BP 138/74 | HR 60 | Ht 64.0 in | Wt 134.0 lb

## 2014-09-28 DIAGNOSIS — N644 Mastodynia: Secondary | ICD-10-CM

## 2014-09-28 DIAGNOSIS — G4700 Insomnia, unspecified: Secondary | ICD-10-CM

## 2014-09-28 NOTE — Patient Instructions (Addendum)
We will be sending you to [facility] for a diagnostic mammogram of the left breast.    Take melatonin at bedtime nightly. If this isn't helpful, then take 25-50mg  of diphenhydramine (in benadryl, simply sleep, and many other OTC medication) prior to going to bed.  Do not take this in the middle of the night or you will be groggy in the morning.  You need at least 8 hours of time before driving. If this isn't helpful, I recommend trial of Belsomra.  This is a new prescription medication and we have vouchers to try it for free. If this isn't helpful, then we can consider ambien, but as we discussed, I have concerns about using this medication. Pay attention to caffeine/chocolate, alcohol, exercise to see if there is anything that might contribute to a poor night's sleep.   Consider relaxation techniques to help get you back to sleep in the middle of the night (deep breathing, calming music).  Insomnia Insomnia is frequent trouble falling and/or staying asleep. Insomnia can be a long term problem or a short term problem. Both are common. Insomnia can be a short term problem when the wakefulness is related to a certain stress or worry. Long term insomnia is often related to ongoing stress during waking hours and/or poor sleeping habits. Overtime, sleep deprivation itself can make the problem worse. Every little thing feels more severe because you are overtired and your ability to cope is decreased. CAUSES   Stress, anxiety, and depression.  Poor sleeping habits.  Distractions such as TV in the bedroom.  Naps close to bedtime.  Engaging in emotionally charged conversations before bed.  Technical reading before sleep.  Alcohol and other sedatives. They may make the problem worse. They can hurt normal sleep patterns and normal dream activity.  Stimulants such as caffeine for several hours prior to bedtime.  Pain syndromes and shortness of breath can cause insomnia.  Exercise late at  night.  Changing time zones may cause sleeping problems (jet lag). It is sometimes helpful to have someone observe your sleeping patterns. They should look for periods of not breathing during the night (sleep apnea). They should also look to see how long those periods last. If you live alone or observers are uncertain, you can also be observed at a sleep clinic where your sleep patterns will be professionally monitored. Sleep apnea requires a checkup and treatment. Give your caregivers your medical history. Give your caregivers observations your family has made about your sleep.  SYMPTOMS   Not feeling rested in the morning.  Anxiety and restlessness at bedtime.  Difficulty falling and staying asleep. TREATMENT   Your caregiver may prescribe treatment for an underlying medical disorders. Your caregiver can give advice or help if you are using alcohol or other drugs for self-medication. Treatment of underlying problems will usually eliminate insomnia problems.  Medications can be prescribed for short time use. They are generally not recommended for lengthy use.  Over-the-counter sleep medicines are not recommended for lengthy use. They can be habit forming.  You can promote easier sleeping by making lifestyle changes such as:  Using relaxation techniques that help with breathing and reduce muscle tension.  Exercising earlier in the day.  Changing your diet and the time of your last meal. No night time snacks.  Establish a regular time to go to bed.  Counseling can help with stressful problems and worry.  Soothing music and white noise may be helpful if there are background noises you cannot remove.  Stop tedious detailed work at least one hour before bedtime. HOME CARE INSTRUCTIONS   Keep a diary. Inform your caregiver about your progress. This includes any medication side effects. See your caregiver regularly. Take note of:  Times when you are asleep.  Times when you are awake  during the night.  The quality of your sleep.  How you feel the next day. This information will help your caregiver care for you.  Get out of bed if you are still awake after 15 minutes. Read or do some quiet activity. Keep the lights down. Wait until you feel sleepy and go back to bed.  Keep regular sleeping and waking hours. Avoid naps.  Exercise regularly.  Avoid distractions at bedtime. Distractions include watching television or engaging in any intense or detailed activity like attempting to balance the household checkbook.  Develop a bedtime ritual. Keep a familiar routine of bathing, brushing your teeth, climbing into bed at the same time each night, listening to soothing music. Routines increase the success of falling to sleep faster.  Use relaxation techniques. This can be using breathing and muscle tension release routines. It can also include visualizing peaceful scenes. You can also help control troubling or intruding thoughts by keeping your mind occupied with boring or repetitive thoughts like the old concept of counting sheep. You can make it more creative like imagining planting one beautiful flower after another in your backyard garden.  During your day, work to eliminate stress. When this is not possible use some of the previous suggestions to help reduce the anxiety that accompanies stressful situations. MAKE SURE YOU:   Understand these instructions.  Will watch your condition.  Will get help right away if you are not doing well or get worse. Document Released: 10/06/2000 Document Revised: 01/01/2012 Document Reviewed: 11/06/2007 [facility] Patient Information 2015 [place], [place]. This information is not intended to replace advice given to you by your health care provider. Make sure you discuss any questions you have with your health care provider.

## 2014-09-28 NOTE — Progress Notes (Signed)
Chief Complaint  Patient presents with  . Breast Problem    spot on left breast that is uncomfortable. Spot is bothering her because it is near the spot where her breast cancer was.   . Insomnia    having sleep issues.   She noticed an uncomfortable spot on her left side, at the lateral breast area near the axilla.  She first noticed it about a couple of months ago.  It hasn't changed.  "It just doesn't feel right".  Feels tender, no actual lump or mass felt.  Denies any strain from tennis or other known injury, or change in activity.  She had breast cancer in the left breast in 1995, treated with lumpectomy, radiation and Tamoxifen. The pain is in the area near her prior surgery.  Doesn't report any swollen glands, nipple drainage or other breast changes.  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  She has been having problems for about the last 5 months.  She gets up once a night to void, but can't get back to sleep afterwards like she used to.  She states that she tried 3-4 OTC meds(cannot tell me the names), none of which helped--she admits that she only took it after she woke up and had trouble getting back to sleep, and then she would feel groggy when she woke up later that morning. She started taking it prior to bedtime, but still got up once to void.  The last couple of nights she slept well without taking anything.    She has 1.5 glasses of wine every night, unchanged x many years. Last night she had been at a party and probably had 3 glasses total (and slept fine).  She has been eating more chocolate in the afternoon. No other caffeine intake or stimulants. No correlation to exercise (she plays tennis 5x/week, and walks 2 dogs 3-4x/day). Puppy sleeps with her, but he doesn't wake her up. Denies depression/anxiety/stress as contributing  PMH, PSH, SH reviewed  Outpatient Encounter Prescriptions as of 09/28/2014  Medication Sig Note  . acetaminophen (TYLENOL) 325 MG tablet Take  650 mg by mouth 2 (two) times daily. 04/23/2014: Uses it once daily  . alendronate (FOSAMAX) 70 MG tablet Take 70 mg by mouth every 7 (seven) days.  04/23/2014: From GYN  . aspirin 81 MG tablet Take 81 mg by mouth daily.   . Biotin 5000 MCG TABS Take 1 tablet by mouth daily.   . calcium carbonate (OS-CAL) 600 MG TABS Take 600 mg by mouth 2 (two) times daily with a meal.   . cholecalciferol (VITAMIN D) 1000 UNITS tablet Take 1,000 Units by mouth daily.   Marland Kitchen donepezil (ARICEPT) 10 MG tablet Take 10 mg by mouth at bedtime.   . ferrous sulfate 325 (65 FE) MG tablet Take 325 mg by mouth daily with breakfast.   . fish oil-omega-3 fatty acids 1000 MG capsule Take 2 g by mouth daily.    Marland Kitchen glucosamine-chondroitin 500-400 MG tablet Take 3 tablets by mouth daily.    . Multiple Vitamins-Minerals (MULTIVITAMIN WITH MINERALS) tablet Take 1 tablet by mouth daily.   . naproxen sodium (ANAPROX) 220 MG tablet Take 440 mg by mouth 2 (two) times daily with a meal. 08/13/2014: Uses occasionally ,prn pain--has used for her foot pain  . omeprazole (PRILOSEC) 40 MG capsule  04/23/2014: From Dr. Collene Mares  . vitamin B-12 (CYANOCOBALAMIN) 100 MCG tablet Take 100 mcg by mouth daily.   . vitamin E 100 UNIT capsule Take 100  Units by mouth daily.   . [DISCONTINUED] FLUVIRIN SUSP     No Known Allergies  ROS:  No fevers, chills, URI symptoms, exertional chest pain (just chest wall pain as per HPI), shortness of breath, bleeding, bruising, rash, depression, anxiety.  +intermittent insomnia.  No urinary complaints, weakness, numbness, tingling or other concerns. Memory is stable.  PHYSICAL EXAM: BP 138/74 mmHg  Pulse 60  Ht 5\' 4"  (1.626 m)  Wt 134 lb (60.782 kg)  BMI 22.99 kg/m2 Well developed, pleasant female in good spirits today (making jokes about not having had sex in a long time). She doesn't appear to be anxious or depressed. Normal hygiene and grooming L breast:  Area of discomfort is at 3o'clock, just medial to one of her  scars and tattoo marks from radiation.  There is no palpable mass or abnormality noted. No nipple drainage or axillary lymphadenopathy.  2 WHSS's at left lateral and superolateral breast  ASSESSMENT/PLAN:  Breast pain, left  Insomnia  Breast pain in Left breast, near location of prior cancer.  Screening mammo normal in April.  Appt set up at [facility] for diagnostic mammogram.  Insomnia--sleep hygiene reviewed. OTC meds reviewed.  Risks of ambien reviewed, and why certain meds are to be avoided, if possible, in the elderly.  Take melatonin at bedtime nightly. If this isn't helpful, then take 25-50mg  of diphenhydramine (in benadryl, simply sleep, and many other OTC medication) prior to going to bed.  Do not take this in the middle of the night or you will be groggy in the morning.  You need at least 8 hours of time before driving. If this isn't helpful, I recommend trial of Belsomra.  This is a new prescription medication and we have vouchers to try it for free. If this isn't helpful, then we can consider ambien, but as we discussed, I have concerns about using this medication. Pay attention to caffeine/chocolate, alcohol, exercise to see if there is anything that might contribute to a poor night's sleep.   Consider relaxation techniques to help get you back to sleep in the middle of the night (deep breathing, calming music).

## 2014-10-05 LAB — HM MAMMOGRAPHY

## 2014-10-06 ENCOUNTER — Encounter: Payer: Self-pay | Admitting: Internal Medicine

## 2014-10-09 ENCOUNTER — Encounter: Payer: Self-pay | Admitting: Family Medicine

## 2014-12-29 DIAGNOSIS — H5203 Hypermetropia, bilateral: Secondary | ICD-10-CM | POA: Diagnosis not present

## 2015-02-18 DIAGNOSIS — Z1231 Encounter for screening mammogram for malignant neoplasm of breast: Secondary | ICD-10-CM | POA: Diagnosis not present

## 2015-02-18 LAB — HM MAMMOGRAPHY

## 2015-02-24 DIAGNOSIS — N6489 Other specified disorders of breast: Secondary | ICD-10-CM | POA: Diagnosis not present

## 2015-02-24 DIAGNOSIS — R928 Other abnormal and inconclusive findings on diagnostic imaging of breast: Secondary | ICD-10-CM | POA: Diagnosis not present

## 2015-02-24 LAB — HM MAMMOGRAPHY

## 2015-03-01 ENCOUNTER — Encounter: Payer: Self-pay | Admitting: Internal Medicine

## 2015-03-02 DIAGNOSIS — N62 Hypertrophy of breast: Secondary | ICD-10-CM | POA: Diagnosis not present

## 2015-03-02 DIAGNOSIS — N6489 Other specified disorders of breast: Secondary | ICD-10-CM | POA: Diagnosis not present

## 2015-03-02 DIAGNOSIS — N63 Unspecified lump in breast: Secondary | ICD-10-CM | POA: Diagnosis not present

## 2015-03-02 DIAGNOSIS — R921 Mammographic calcification found on diagnostic imaging of breast: Secondary | ICD-10-CM | POA: Diagnosis not present

## 2015-03-09 ENCOUNTER — Encounter: Payer: Self-pay | Admitting: Family Medicine

## 2015-03-11 DIAGNOSIS — N6091 Unspecified benign mammary dysplasia of right breast: Secondary | ICD-10-CM | POA: Diagnosis not present

## 2015-03-20 NOTE — Pre-Procedure Instructions (Addendum)
Evelyn White  03/20/2015       Your procedure is scheduled on June 2.  Report to [facility] Admitting at 9 A.M.  Call this number if you have problems the morning of surgery:  [phone]   Remember:  Do not eat food or drink liquids after midnight.  Take these medicines the morning of surgery with A SIP OF WATER Tylenol (if needed), Omeprazole,    STOP Aspirin, Biotin, Calcium, Vitamin D, Fish Oil, Glucosamine, Multiple Vitamins, Naproxen, B12, Vitamin E today   STOP/ Do not take Aspirin, Aleve, Naproxen, Advil, Ibuprofen, Motrin, Vitamins, Herbs, or Supplements starting today   Do not wear jewelry, make-up or nail polish.  Do not wear lotions, powders, or perfumes.  You may wear deodorant.  Do not shave 48 hours prior to surgery.  Men may shave face and neck.  Do not bring valuables to the hospital.  [facility] is not responsible for any belongings or valuables.  Contacts, dentures or bridgework may not be worn into surgery.  Leave your suitcase in the car.  After surgery it may be brought to your room.  For patients admitted to the hospital, discharge time will be determined by your treatment team.  Patients discharged the day of surgery will not be allowed to drive home.   Name and phone number of your driver:   [facility] - Preparing for Surgery  Before surgery, you can play an important role.  Because skin is not sterile, your skin needs to be as free of germs as possible.  You can reduce the number of germs on you skin by washing with CHG (chlorahexidine gluconate) soap before surgery.  CHG is an antiseptic cleaner which kills germs and bonds with the skin to continue killing germs even after washing.  Please DO NOT use if you have an allergy to CHG or antibacterial soaps.  If your skin becomes reddened/irritated stop using the CHG and inform your nurse when you arrive at Short Stay.  Do not shave (including legs and underarms) for at  least 48 hours prior to the first CHG shower.  You may shave your face.  Please follow these instructions carefully:   1.  Shower with CHG Soap the night before surgery and the morning of Surgery.  2.  If you choose to wash your hair, wash your hair first as usual with your normal shampoo.  3.  After you shampoo, rinse your hair and body thoroughly to remove the shampoo.  4.  Use CHG as you would any other liquid soap.  You can apply CHG directly to the skin and wash gently with scrungie or a clean washcloth.  5.  Apply the CHG Soap to your body ONLY FROM THE NECK DOWN.  Do not use on open wounds or open sores.  Avoid contact with your eyes, ears, mouth and genitals (private parts).  Wash genitals (private parts) with your normal soap.  6.  Wash thoroughly, paying special attention to the area where your surgery will be performed.  7.  Thoroughly rinse your body with warm water from the neck down.  8.  DO NOT shower/wash with your normal soap after using and rinsing off the CHG Soap.  9.  Pat yourself dry with a clean towel.            10.  Wear clean pajamas.            11.  Place clean  sheets on your bed the night of your first shower and do not sleep with pets.  Day of Surgery  Do not apply any lotions the morning of surgery.  Please wear clean clothes to the hospital/surgery center.     Please read over the following fact sheets that you were given. Pain Booklet, Coughing and Deep Breathing and Surgical Site Infection Prevention

## 2015-03-23 ENCOUNTER — Inpatient Hospital Stay (HOSPITAL_COMMUNITY)
Admission: RE | Admit: 2015-03-23 | Discharge: 2015-03-23 | Disposition: A | Discharge status: Home / Self Care | Payer: Self-pay | Source: Ambulatory Visit

## 2015-03-23 ENCOUNTER — Other Ambulatory Visit: Payer: Self-pay | Admitting: General Surgery

## 2015-03-23 ENCOUNTER — Encounter (HOSPITAL_COMMUNITY): Payer: Self-pay

## 2015-03-23 ENCOUNTER — Encounter: Payer: Self-pay | Admitting: Family Medicine

## 2015-03-23 DIAGNOSIS — N6091 Unspecified benign mammary dysplasia of right breast: Secondary | ICD-10-CM | POA: Insufficient documentation

## 2015-03-23 DIAGNOSIS — Z01818 Encounter for other preprocedural examination: Secondary | ICD-10-CM | POA: Diagnosis not present

## 2015-03-24 MED ORDER — CEFAZOLIN SODIUM-DEXTROSE 2-3 GM-% IV SOLR
2.0000 g | INTRAVENOUS | Status: AC
  Administered 2015-03-25: 2 g via INTRAVENOUS
  Filled 2015-03-24: qty 50

## 2015-03-25 ENCOUNTER — Ambulatory Visit (HOSPITAL_COMMUNITY): Payer: Medicare Other | Admitting: Certified Registered Nurse Anesthetist

## 2015-03-25 ENCOUNTER — Encounter (HOSPITAL_COMMUNITY): Payer: Self-pay | Admitting: Certified Registered Nurse Anesthetist

## 2015-03-25 ENCOUNTER — Encounter (HOSPITAL_COMMUNITY)
Admission: RE | Disposition: A | Discharge status: Home / Self Care | Payer: Self-pay | Source: Ambulatory Visit | Attending: General Surgery

## 2015-03-25 ENCOUNTER — Ambulatory Visit (HOSPITAL_COMMUNITY)
Admission: RE | Admit: 2015-03-25 | Discharge: 2015-03-25 | Disposition: A | Discharge status: Home / Self Care | Payer: Medicare Other | Source: Ambulatory Visit | Attending: General Surgery | Admitting: General Surgery

## 2015-03-25 DIAGNOSIS — Z7982 Long term (current) use of aspirin: Secondary | ICD-10-CM | POA: Insufficient documentation

## 2015-03-25 DIAGNOSIS — Z87891 Personal history of nicotine dependence: Secondary | ICD-10-CM | POA: Diagnosis not present

## 2015-03-25 DIAGNOSIS — K219 Gastro-esophageal reflux disease without esophagitis: Secondary | ICD-10-CM | POA: Diagnosis not present

## 2015-03-25 DIAGNOSIS — N6091 Unspecified benign mammary dysplasia of right breast: Secondary | ICD-10-CM | POA: Diagnosis not present

## 2015-03-25 DIAGNOSIS — Z79899 Other long term (current) drug therapy: Secondary | ICD-10-CM | POA: Diagnosis not present

## 2015-03-25 DIAGNOSIS — Z853 Personal history of malignant neoplasm of breast: Secondary | ICD-10-CM | POA: Diagnosis not present

## 2015-03-25 DIAGNOSIS — N62 Hypertrophy of breast: Secondary | ICD-10-CM | POA: Diagnosis present

## 2015-03-25 DIAGNOSIS — C50911 Malignant neoplasm of unspecified site of right female breast: Secondary | ICD-10-CM | POA: Insufficient documentation

## 2015-03-25 DIAGNOSIS — N6089 Other benign mammary dysplasias of unspecified breast: Secondary | ICD-10-CM | POA: Diagnosis not present

## 2015-03-25 HISTORY — PX: RADIOACTIVE SEED GUIDED EXCISIONAL BREAST BIOPSY: SHX6490

## 2015-03-25 LAB — CBC WITH DIFFERENTIAL/PLATELET
BASOS ABS: 0 10*3/uL (ref 0.0–0.1)
BASOS PCT: 1 % (ref 0–1)
EOS ABS: 0.1 10*3/uL (ref 0.0–0.7)
EOS PCT: 1 % (ref 0–5)
HEMATOCRIT: 41 % (ref 36.0–46.0)
Hemoglobin: 13.8 g/dL (ref 12.0–15.0)
LYMPHS ABS: 1.5 10*3/uL (ref 0.7–4.0)
Lymphocytes Relative: 24 % (ref 12–46)
MCH: 30.8 pg (ref 26.0–34.0)
MCHC: 33.7 g/dL (ref 30.0–36.0)
MCV: 91.5 fL (ref 78.0–100.0)
Monocytes Absolute: 0.5 10*3/uL (ref 0.1–1.0)
Monocytes Relative: 8 % (ref 3–12)
NEUTROS ABS: 4.2 10*3/uL (ref 1.7–7.7)
Neutrophils Relative %: 66 % (ref 43–77)
PLATELETS: 276 10*3/uL (ref 150–400)
RBC: 4.48 MIL/uL (ref 3.87–5.11)
RDW: 12.4 % (ref 11.5–15.5)
WBC: 6.4 10*3/uL (ref 4.0–10.5)

## 2015-03-25 LAB — BASIC METABOLIC PANEL
ANION GAP: 10 (ref 5–15)
BUN: 15 mg/dL (ref 6–20)
CALCIUM: 9 mg/dL (ref 8.9–10.3)
CO2: 24 mmol/L (ref 22–32)
Chloride: 104 mmol/L (ref 101–111)
Creatinine, Ser: 0.75 mg/dL (ref 0.44–1.00)
GFR calc non Af Amer: >60 mL/min (ref >60)
Glucose, Bld: 106 mg/dL — ABNORMAL HIGH (ref 65–99)
Potassium: 4.2 mmol/L (ref 3.5–5.1)
SODIUM: 138 mmol/L (ref 135–145)

## 2015-03-25 SURGERY — RADIOACTIVE SEED GUIDED BREAST BIOPSY
Anesthesia: General | Site: Breast | Laterality: Right

## 2015-03-25 MED ORDER — MIDAZOLAM HCL 2 MG/2ML IJ SOLN
INTRAMUSCULAR | Status: AC
  Filled 2015-03-25: qty 2

## 2015-03-25 MED ORDER — 0.9 % SODIUM CHLORIDE (POUR BTL) OPTIME
TOPICAL | Status: DC | PRN
  Administered 2015-03-25: 1000 mL

## 2015-03-25 MED ORDER — PROPOFOL 10 MG/ML IV BOLUS
INTRAVENOUS | Status: DC | PRN
  Administered 2015-03-25: 150 mg via INTRAVENOUS

## 2015-03-25 MED ORDER — MORPHINE SULFATE 2 MG/ML IJ SOLN: 2.0000 mg | INTRAMUSCULAR | Status: DC | PRN

## 2015-03-25 MED ORDER — BUPIVACAINE HCL (PF) 0.25 % IJ SOLN
INTRAMUSCULAR | Status: AC
  Filled 2015-03-25: qty 30

## 2015-03-25 MED ORDER — LACTATED RINGERS IV SOLN
INTRAVENOUS | Status: DC
  Administered 2015-03-25 (×2): via INTRAVENOUS

## 2015-03-25 MED ORDER — DEXAMETHASONE SODIUM PHOSPHATE 4 MG/ML IJ SOLN
INTRAMUSCULAR | Status: AC
  Filled 2015-03-25: qty 2

## 2015-03-25 MED ORDER — DEXAMETHASONE SODIUM PHOSPHATE 4 MG/ML IJ SOLN
INTRAMUSCULAR | Status: DC | PRN
  Administered 2015-03-25: 4 mg via INTRAVENOUS

## 2015-03-25 MED ORDER — OXYCODONE HCL 5 MG PO TABS: 5.0000 mg | ORAL_TABLET | ORAL | Status: DC | PRN

## 2015-03-25 MED ORDER — PROPOFOL 10 MG/ML IV BOLUS
INTRAVENOUS | Status: AC
  Filled 2015-03-25: qty 20

## 2015-03-25 MED ORDER — ACETAMINOPHEN 650 MG RE SUPP: 650.0000 mg | RECTAL | Status: DC | PRN

## 2015-03-25 MED ORDER — FENTANYL CITRATE (PF) 100 MCG/2ML IJ SOLN
INTRAMUSCULAR | Status: DC | PRN
Start: 2015-03-25 — End: 2015-03-25
  Administered 2015-03-25: 25 ug via INTRAVENOUS
  Administered 2015-03-25: 50 ug via INTRAVENOUS

## 2015-03-25 MED ORDER — LIDOCAINE HCL (CARDIAC) 20 MG/ML IV SOLN
INTRAVENOUS | Status: AC
  Filled 2015-03-25: qty 5

## 2015-03-25 MED ORDER — SODIUM CHLORIDE 0.9 % IJ SOLN: 3.0000 mL | INTRAMUSCULAR | Status: DC | PRN

## 2015-03-25 MED ORDER — ACETAMINOPHEN 325 MG PO TABS: 650.0000 mg | ORAL_TABLET | ORAL | Status: DC | PRN

## 2015-03-25 MED ORDER — LIDOCAINE HCL (CARDIAC) 20 MG/ML IV SOLN
INTRAVENOUS | Status: DC | PRN
  Administered 2015-03-25: 60 mg via INTRAVENOUS

## 2015-03-25 MED ORDER — HYDROMORPHONE HCL 1 MG/ML IJ SOLN: 0.2500 mg | INTRAMUSCULAR | Status: DC | PRN

## 2015-03-25 MED ORDER — BUPIVACAINE HCL (PF) 0.25 % IJ SOLN
INTRAMUSCULAR | Status: DC | PRN
  Administered 2015-03-25: 10 mL

## 2015-03-25 MED ORDER — ONDANSETRON HCL 4 MG/2ML IJ SOLN
INTRAMUSCULAR | Status: AC
  Filled 2015-03-25: qty 2

## 2015-03-25 MED ORDER — OXYCODONE-ACETAMINOPHEN 10-325 MG PO TABS: 1.0000 | ORAL_TABLET | Freq: Four times a day (QID) | ORAL | Status: DC | PRN

## 2015-03-25 MED ORDER — OXYCODONE HCL 5 MG/5ML PO SOLN: 5.0000 mg | Freq: Once | ORAL | Status: DC | PRN

## 2015-03-25 MED ORDER — SODIUM CHLORIDE 0.9 % IJ SOLN: 3.0000 mL | Freq: Two times a day (BID) | INTRAMUSCULAR | Status: DC

## 2015-03-25 MED ORDER — ONDANSETRON HCL 4 MG/2ML IJ SOLN
INTRAMUSCULAR | Status: DC | PRN
  Administered 2015-03-25: 4 mg via INTRAVENOUS

## 2015-03-25 MED ORDER — PROMETHAZINE HCL 25 MG/ML IJ SOLN: 6.2500 mg | INTRAMUSCULAR | Status: DC | PRN

## 2015-03-25 MED ORDER — SODIUM CHLORIDE 0.9 % IV SOLN: 250.0000 mL | INTRAVENOUS | Status: DC | PRN

## 2015-03-25 MED ORDER — OXYCODONE HCL 5 MG PO TABS: 5.0000 mg | ORAL_TABLET | Freq: Once | ORAL | Status: DC | PRN

## 2015-03-25 MED ORDER — MIDAZOLAM HCL 5 MG/5ML IJ SOLN
INTRAMUSCULAR | Status: DC | PRN
Start: 2015-03-25 — End: 2015-03-25
  Administered 2015-03-25: 2 mg via INTRAVENOUS

## 2015-03-25 MED ORDER — FENTANYL CITRATE (PF) 250 MCG/5ML IJ SOLN
INTRAMUSCULAR | Status: AC
  Filled 2015-03-25: qty 5

## 2015-03-25 MED ORDER — SODIUM CHLORIDE 0.9 % IV SOLN: INTRAVENOUS | Status: DC

## 2015-03-25 SURGICAL SUPPLY — 49 items
APPLIER CLIP 9.375 MED OPEN (MISCELLANEOUS) ×3
APR CLP MED 9.3 20 MLT OPN (MISCELLANEOUS) ×1
BINDER BREAST LRG (GAUZE/BANDAGES/DRESSINGS) ×2 IMPLANT
BINDER BREAST XLRG (GAUZE/BANDAGES/DRESSINGS) IMPLANT
BLADE SURG 10 STRL SS (BLADE) ×1 IMPLANT
BLADE SURG 15 STRL LF DISP TIS (BLADE) ×1 IMPLANT
BLADE SURG 15 STRL SS (BLADE) ×3
CANISTER SUCTION 2500CC (MISCELLANEOUS) IMPLANT
CHLORAPREP W/TINT 26ML (MISCELLANEOUS) ×3 IMPLANT
CLIP APPLIE 9.375 MED OPEN (MISCELLANEOUS) IMPLANT
CLOSURE WOUND 1/2 X4 (GAUZE/BANDAGES/DRESSINGS) ×1
COVER PROBE W GEL 5X96 (DRAPES) ×3 IMPLANT
COVER SURGICAL LIGHT HANDLE (MISCELLANEOUS) ×3 IMPLANT
DEVICE DUBIN SPECIMEN MAMMOGRA (MISCELLANEOUS) ×3 IMPLANT
DRAPE CHEST BREAST 15X10 FENES (DRAPES) ×3 IMPLANT
DRAPE UTILITY W/TAPE 26X15 (DRAPES) ×5 IMPLANT
ELECT COATED BLADE 2.86 ST (ELECTRODE) ×3 IMPLANT
ELECT REM PT RETURN 9FT ADLT (ELECTROSURGICAL) ×3
ELECTRODE REM PT RTRN 9FT ADLT (ELECTROSURGICAL) ×1 IMPLANT
GLOVE BIO SURGEON STRL SZ7 (GLOVE) ×3 IMPLANT
GLOVE BIOGEL PI IND STRL 7.5 (GLOVE) ×1 IMPLANT
GLOVE BIOGEL PI INDICATOR 7.5 (GLOVE) ×2
GLOVE SURG SS PI 6.5 STRL IVOR (GLOVE) ×2 IMPLANT
GOWN STRL REUS W/ TWL LRG LVL3 (GOWN DISPOSABLE) ×2 IMPLANT
GOWN STRL REUS W/TWL LRG LVL3 (GOWN DISPOSABLE) ×6
KIT BASIN OR (CUSTOM PROCEDURE TRAY) ×3 IMPLANT
KIT MARKER MARGIN INK (KITS) ×3 IMPLANT
LIQUID BAND (GAUZE/BANDAGES/DRESSINGS) ×2 IMPLANT
NDL HYPO 25X1 1.5 SAFETY (NEEDLE) ×1 IMPLANT
NEEDLE HYPO 25X1 1.5 SAFETY (NEEDLE) ×3 IMPLANT
NS IRRIG 1000ML POUR BTL (IV SOLUTION) IMPLANT
PACK SURGICAL SETUP 50X90 (CUSTOM PROCEDURE TRAY) ×3 IMPLANT
PENCIL BUTTON HOLSTER BLD 10FT (ELECTRODE) ×3 IMPLANT
SPONGE LAP 18X18 X RAY DECT (DISPOSABLE) ×3 IMPLANT
STRIP CLOSURE SKIN 1/2X4 (GAUZE/BANDAGES/DRESSINGS) ×1 IMPLANT
SUT MNCRL AB 4-0 PS2 18 (SUTURE) ×3 IMPLANT
SUT MON AB 5-0 PS2 18 (SUTURE) ×2 IMPLANT
SUT SILK 2 0 SH (SUTURE) ×2 IMPLANT
SUT VIC AB 2-0 SH 27 (SUTURE) ×3
SUT VIC AB 2-0 SH 27XBRD (SUTURE) ×1 IMPLANT
SUT VIC AB 3-0 SH 27 (SUTURE) ×3
SUT VIC AB 3-0 SH 27X BRD (SUTURE) ×1 IMPLANT
SYR BULB 3OZ (MISCELLANEOUS) ×3 IMPLANT
SYR CONTROL 10ML LL (SYRINGE) ×3 IMPLANT
TOWEL OR 17X24 6PK STRL BLUE (TOWEL DISPOSABLE) ×1 IMPLANT
TOWEL OR 17X26 10 PK STRL BLUE (TOWEL DISPOSABLE) ×3 IMPLANT
TUBE CONNECTING 12'X1/4 (SUCTIONS)
TUBE CONNECTING 12X1/4 (SUCTIONS) IMPLANT
YANKAUER SUCT BULB TIP NO VENT (SUCTIONS) IMPLANT

## 2015-03-25 NOTE — Interval H&P Note (Signed)
History and Physical Interval Note:  03/25/2015 10:24 AM  Evelyn White  has presented today for surgery, with the diagnosis of RIGHT BREAST ALH  The various methods of treatment have been discussed with the patient and family. After consideration of risks, benefits and other options for treatment, the patient has consented to  Procedure(s): RADIOACTIVE SEED GUIDED EXCISIONAL BREAST BIOPSY (Right) as a surgical intervention .  The patient's history has been reviewed, patient examined, no change in status, stable for surgery.  I have reviewed the patient's chart and labs.  Questions were answered to the patient's satisfaction.     Jag Lenz

## 2015-03-25 NOTE — H&P (Signed)
   58 yof who presents with new right breast mass. she has prior history of left breast lumpectomy, likely a sentinel node also. she has radiation tattoos as well. did not have chemo. she has memory loss issues and does not remember specifics of treatment. this side has no issues. the right side had 8 mm distortion seen by mm (nothing by [place]). this underwent core biopsy with finding of atypical lobular hyperplasia. She has no complaints referable to either breast.    Other Problems Marjean Donna, CMA; 03/11/2015 9:34 AM) Breast Cancer  Diagnostic Studies History Marjean Donna, CMA; 03/11/2015 9:34 AM) Mammogram within last year  Allergies Davy Pique Bynum, [place]; 03/11/2015 9:36 AM) No Known Drug Allergies05/19/2016  Medication History (Sonya Bynum, CMA; 03/11/2015 9:38 AM) Alendronate Sodium (70MG  Tablet, Oral) Active. Donepezil HCl (10MG  Tablet, Oral) Active. Omeprazole (40MG  Capsule DR, Oral) Active. Aspirin EC (81MG  Tablet DR, Oral) Active. Biotin (5000MCG Capsule, Oral) Active. Calcium Carbonate (600MG  Tablet, Oral) Active. Aricept (10MG  Tablet, Oral) Active. Ferrous Sulfate (325 (65 Fe)MG Tablet DR, Oral) Active. Fish Oil (1000MG  Capsule DR, Oral) Active. Multivitamins (Oral) Active. Naproxen (250MG  Tablet, Oral) Active. Medications Reconciled  Social History Marjean Donna, CMA; 03/11/2015 9:34 AM) Alcohol use Moderate alcohol use. Caffeine use Carbonated beverages. Illicit drug use Remotely quit drug use. Tobacco use Former smoker.  Pregnancy / Birth History Marjean Donna, [place]; 03/11/2015 9:34 AM) Age at menarche 40 years. Age of menopause <45 Para 3  Review of Systems ([place]; 03/11/2015 9:34 AM) General Present- Fatigue. Not Present- Appetite Loss, Chills, Fever, Night Sweats, Weight Gain and Weight Loss. Skin Present- Dryness. Not Present- Change in Wart/Mole, Hives, Jaundice, New Lesions, Non-Healing Wounds, Rash and Ulcer. HEENT Present- Ringing  in the Ears and Seasonal Allergies. Not Present- Earache, Hearing Loss, Hoarseness, Nose Bleed, Oral Ulcers, Sinus Pain, Sore Throat, Visual Disturbances, Wears glasses/contact lenses and Yellow Eyes. Musculoskeletal Present- Joint Pain. Not Present- Back Pain, Joint Stiffness, Muscle Pain, Muscle Weakness and Swelling of Extremities. Neurological Present- Decreased Memory. Not Present- Fainting, Headaches, Numbness, Seizures, Tingling, Tremor, Trouble walking and Weakness.   Vitals (Sonya Bynum CMA; 03/11/2015 9:35 AM) 03/11/2015 9:35 AM Weight: 142.2 lb Height: 64in Body Surface Area: 1.71 m Body Mass Index: 24.41 kg/m Temp.: 97.34F(Temporal)  Pulse: 88 (Regular)  BP: 128/70 (Sitting, Left Arm, Standard)    Physical Exam Rolm Bookbinder MD; 03/11/2015 10:02 AM) General Mental Status-Alert. Orientation-Oriented X3.  Chest and Lung Exam Chest and lung exam reveals -on auscultation, normal breath sounds, no adventitious sounds and normal vocal resonance.  Breast Nipples-No Discharge. Breast - Left-Lumpectomy scar. Breast Lump-No Palpable Breast Mass.  Cardiovascular Cardiovascular examination reveals -normal heart sounds, regular rate and rhythm with no murmurs.  Lymphatic Head & Neck  General Head & Neck Lymphatics: Bilateral - Description - Normal. Axillary  General Axillary Region: Bilateral - Description - Normal. Note: healed left axillary incision    Assessment & Plan Rolm Bookbinder MD; 03/11/2015 10:03 AM) ATYPICAL LOBULAR HYPERPLASIA OF RIGHT BREAST (610.8  N60.91)  we discussed rate of upgrade on excision for alh. I think reasonable to excise this given her prior history of breast cancer also. discussed right breast radioactive seed guided excisional biopsy. risks/postop recovery discussed. discussed if cancer will likely need further evaluation and surgery

## 2015-03-25 NOTE — Anesthesia Preprocedure Evaluation (Addendum)
Anesthesia Evaluation  Patient identified by MRN, date of birth, ID band Patient awake    Reviewed: Allergy & Precautions, NPO status , Patient's Chart, lab work & pertinent test results  Airway Mallampati: II  TM Distance: >3 FB Neck ROM: Full    Dental  (+) Teeth Intact, Dental Advisory Given   Pulmonary former smoker,  breath sounds clear to auscultation        Cardiovascular negative cardio ROS  Rhythm:Regular Rate:Normal     Neuro/Psych negative neurological ROS     GI/Hepatic Neg liver ROS, GERD-  ,  Endo/Other  negative endocrine ROS  Renal/GU negative Renal ROS     Musculoskeletal  (+) Arthritis -,   Abdominal   Peds  Hematology negative hematology ROS (+)   Anesthesia Other Findings   Reproductive/Obstetrics                           Anesthesia Physical Anesthesia Plan  ASA: II  Anesthesia Plan: General   Post-op Pain Management:    Induction: Intravenous  Airway Management Planned: LMA  Additional Equipment:   Intra-op Plan:   Post-operative Plan:   Informed Consent: I have reviewed the patients History and Physical, chart, labs and discussed the procedure including the risks, benefits and alternatives for the proposed anesthesia with the patient or authorized representative who has indicated his/her understanding and acceptance.   Dental advisory given  Plan Discussed with: CRNA  Anesthesia Plan Comments:         Anesthesia Quick Evaluation

## 2015-03-25 NOTE — Op Note (Signed)
Preoperative diagnosis: Right breast atypical lobular hyperplasia Postoperative diagnosis: Same as above Procedure: Right breast radioactive seed guided excisional biopsy Surgeon: Dr. Serita Grammes Anesthesia: Gen. Complications: None Drains: None Specimens: Right breast tissue marked with paint Complications: None Estimated blood loss: Minimal Sponge and needle count was correct at completion Disposition to recovery stable  Indications: This is 74 yo female with a history of a left breast cancer who underwent mammography with an abdomen around that underwent biopsy showed atypical lobular hyperplasia we discussed her options and have decided to excise this area given her history as well as the core biopsy results. She had a radioactive seed placed prior to beginning.  Procedure: After informed consent was obtained the patient was taken to the operating room. She was given antibiotics. Sequential compression devices were on ner legs. She was placed under general anesthsia with an LMA. Her breast was then prepped and draped in the standard sterile surgical fashion. A surgical timeout was then performed.  I infiltrated Marcaine around the areola and the area the incision. I then made a periareolar incision. I used the neoprobe to guide the excision of the radioactive seed in the surrounding tissue. I confirmed removal of the seed with the neoprobe. Faxitron mammogram was taken confirming removal of the clip and the seed. There was no more radioactivity in the breast. This was then sent to pathology. This was confirmed by the radiologist. I then obtained hemostasis. This was closer to Vicryl, 3-0 Vicryl, and 4-0 Monocryl. Steri-Strips were applied. A breast binder was placed. She was then extubated and transferred to recovery stable.

## 2015-03-25 NOTE — Transfer of Care (Signed)
Immediate Anesthesia Transfer of Care Note  Patient: Evelyn White  Procedure(s) Performed: Procedure(s): RADIOACTIVE SEED GUIDED RIGHT EXCISIONAL BREAST BIOPSY (Right)  Patient Location: PACU  Anesthesia Type:General  Level of Consciousness: awake, alert  and oriented  Airway & Oxygen Therapy: Patient Spontanous Breathing and Patient connected to nasal cannula oxygen  Post-op Assessment: Report given to RN, Post -op Vital signs reviewed and stable and Patient moving all extremities X 4  Post vital signs: Reviewed and stable  Last Vitals:  Filed Vitals:   03/25/15 1140  BP: 138/61  Pulse: 78  Temp: 36.5 C  Resp: 18    Complications: No apparent anesthesia complications

## 2015-03-25 NOTE — Anesthesia Procedure Notes (Signed)
Procedure Name: LMA Insertion Date/Time: 03/25/2015 10:55 AM Performed by: Ollen Bowl Pre-anesthesia Checklist: Patient identified, Emergency Drugs available, Suction available, Patient being monitored and Timeout performed Patient Re-evaluated:Patient Re-evaluated prior to inductionOxygen Delivery Method: Circle system utilized and Simple face mask Preoxygenation: Pre-oxygenation with 100% oxygen Intubation Type: IV induction Ventilation: Mask ventilation without difficulty LMA Size: 4.0 Number of attempts: 1 Airway Equipment and Method: Patient positioned with wedge pillow Placement Confirmation: positive ETCO2 and breath sounds checked- equal and bilateral Tube secured with: Tape Dental Injury: Teeth and Oropharynx as per pre-operative assessment

## 2015-03-25 NOTE — Discharge Instructions (Signed)
[facility] °Office Phone Number [phone] °POST OP INSTRUCTIONS ° °Always review your discharge instruction sheet given to you by the facility where your surgery was performed. ° °IF YOU HAVE DISABILITY OR FAMILY LEAVE FORMS, YOU MUST BRING THEM TO THE OFFICE FOR PROCESSING.  DO NOT GIVE THEM TO YOUR DOCTOR. ° °1. A prescription for pain medication may be given to you upon discharge.  Take your pain medication as prescribed, if needed.  If narcotic pain medicine is not needed, then you may take acetaminophen (Tylenol), naprosyn (Alleve) or ibuprofen (Advil) as needed. °2. Take your usually prescribed medications unless otherwise directed °3. If you need a refill on your pain medication, please contact your pharmacy.  They will contact our office to request authorization.  Prescriptions will not be filled after 5pm or on week-ends. °4. You should eat very light the first 24 hours after surgery, such as soup, crackers, pudding, etc.  Resume your normal diet the day after surgery. °5. Most patients will experience some swelling and bruising in the breast.  Ice packs and a good support bra will help.  Wear the breast binder provided or a sports bra for 72 hours day and night.  After that wear a sports bra during the day until you return to the office. Swelling and bruising can take several days to resolve.  °6. It is common to experience some constipation if taking pain medication after surgery.  Increasing fluid intake and taking a stool softener will usually help or prevent this problem from occurring.  A mild laxative (Milk of Magnesia or Miralax) should be taken according to package directions if there are no bowel movements after 48 hours. °7. Unless discharge instructions indicate otherwise, you may remove your bandages 48 hours after surgery and you may shower at that time.  You may have steri-strips (small skin tapes) in place directly over the incision.  These strips should be left on the  skin for 7-10 days and will come off on their own.  If your surgeon used skin glue on the incision, you may shower in 24 hours.  The glue will flake off over the next 2-3 weeks.  Any sutures or staples will be removed at the office during your follow-up visit. °8. ACTIVITIES:  You may resume regular daily activities (gradually increasing) beginning the next day.  Wearing a good support bra or sports bra minimizes pain and swelling.  You may have sexual intercourse when it is comfortable. °a. You may drive when you no longer are taking prescription pain medication, you can comfortably wear a seatbelt, and you can safely maneuver your car and apply brakes. °b. RETURN TO WORK:  ______________________________________________________________________________________ °9. You should see your doctor in the office for a follow-up appointment approximately two weeks after your surgery.  Your doctor’s nurse will typically make your follow-up appointment when she calls you with your pathology report.  Expect your pathology report 3-4 business days after your surgery.  You may call to check if you do not hear from us after three days. °10. OTHER INSTRUCTIONS: _______________________________________________________________________________________________ _____________________________________________________________________________________________________________________________________ °_____________________________________________________________________________________________________________________________________ °_____________________________________________________________________________________________________________________________________ ° °WHEN TO CALL DR Yunior Jain: °1. Fever over 101.0 °2. Nausea and/or vomiting. °3. Extreme swelling or bruising. °4. Continued bleeding from incision. °5. Increased pain, redness, or drainage from the incision. ° °The clinic staff is available to answer your questions during regular  business hours.  Please don’t hesitate to call and ask to speak to one of the nurses for clinical concerns.  If you   have a medical emergency, go to the nearest emergency room or call 911.  A surgeon from [facility] is always on call at the hospital. ° °For further questions, please visit [email or web address] ° °

## 2015-03-25 NOTE — Anesthesia Postprocedure Evaluation (Signed)
  Anesthesia Post-op Note  Patient: Evelyn White  Procedure(s) Performed: Procedure(s): RADIOACTIVE SEED GUIDED RIGHT EXCISIONAL BREAST BIOPSY (Right)  Patient Location: PACU  Anesthesia Type:General  Level of Consciousness: awake, alert  and oriented  Airway and Oxygen Therapy: Patient Spontanous Breathing  Post-op Pain: mild  Post-op Assessment: Post-op Vital signs reviewed  Post-op Vital Signs: Reviewed  Last Vitals:  Filed Vitals:   03/25/15 1211  BP: 157/82  Pulse: 81  Temp:   Resp:     Complications: No apparent anesthesia complications

## 2015-03-26 ENCOUNTER — Encounter (HOSPITAL_COMMUNITY): Payer: Self-pay | Admitting: General Surgery

## 2015-03-31 ENCOUNTER — Other Ambulatory Visit: Payer: Self-pay | Admitting: Family Medicine

## 2015-03-31 ENCOUNTER — Encounter: Payer: Self-pay | Admitting: Family Medicine

## 2015-03-31 ENCOUNTER — Ambulatory Visit
Admission: RE | Admit: 2015-03-31 | Discharge: 2015-03-31 | Disposition: A | Discharge status: Home / Self Care | Payer: Medicare Other | Source: Ambulatory Visit | Attending: Family Medicine | Admitting: Family Medicine

## 2015-03-31 ENCOUNTER — Ambulatory Visit (INDEPENDENT_AMBULATORY_CARE_PROVIDER_SITE_OTHER): Payer: Medicare Other | Admitting: Family Medicine

## 2015-03-31 VITALS — BP 146/70 | HR 80 | Wt 137.6 lb

## 2015-03-31 DIAGNOSIS — F321 Major depressive disorder, single episode, moderate: Secondary | ICD-10-CM

## 2015-03-31 DIAGNOSIS — N62 Hypertrophy of breast: Secondary | ICD-10-CM

## 2015-03-31 DIAGNOSIS — Z124 Encounter for screening for malignant neoplasm of cervix: Secondary | ICD-10-CM | POA: Diagnosis not present

## 2015-03-31 DIAGNOSIS — S99921A Unspecified injury of right foot, initial encounter: Secondary | ICD-10-CM | POA: Diagnosis not present

## 2015-03-31 DIAGNOSIS — Z01419 Encounter for gynecological examination (general) (routine) without abnormal findings: Secondary | ICD-10-CM | POA: Diagnosis not present

## 2015-03-31 DIAGNOSIS — M79671 Pain in right foot: Secondary | ICD-10-CM | POA: Diagnosis not present

## 2015-03-31 DIAGNOSIS — M810 Age-related osteoporosis without current pathological fracture: Secondary | ICD-10-CM | POA: Diagnosis not present

## 2015-03-31 DIAGNOSIS — G4700 Insomnia, unspecified: Secondary | ICD-10-CM | POA: Diagnosis not present

## 2015-03-31 DIAGNOSIS — Z6823 Body mass index (BMI) 23.0-23.9, adult: Secondary | ICD-10-CM | POA: Diagnosis not present

## 2015-03-31 DIAGNOSIS — R413 Other amnesia: Secondary | ICD-10-CM

## 2015-03-31 DIAGNOSIS — C50919 Malignant neoplasm of unspecified site of unspecified female breast: Secondary | ICD-10-CM | POA: Diagnosis not present

## 2015-03-31 DIAGNOSIS — N6091 Unspecified benign mammary dysplasia of right breast: Secondary | ICD-10-CM

## 2015-03-31 LAB — TSH: TSH: 0.723 u[IU]/mL (ref 0.350–4.500)

## 2015-03-31 MED ORDER — ESCITALOPRAM OXALATE 10 MG PO TABS: ORAL_TABLET | ORAL | Status: DC

## 2015-03-31 MED ORDER — TRAZODONE HCL 50 MG PO TABS: ORAL_TABLET | ORAL | Status: DC

## 2015-03-31 NOTE — Addendum Note (Signed)
Addended byRita Ohara on: 03/31/2015 04:59 PM   Modules accepted: Level of Service

## 2015-03-31 NOTE — Telephone Encounter (Signed)
These were sent to her pharmacy TODAY (new prescriptions from today's visit).  Apparently she must be requesting 90 day supply.  Please let them/her know that I want her to start with 30 day supply--if she is doing well on these medications at her follow-up in one month, then I will change them to 90 day supply.  But for now, until we know how these two new medications are tolerated, I want to start with 30 day supply.  Thanks

## 2015-03-31 NOTE — Progress Notes (Addendum)
Chief Complaint  Patient presents with  . Depression    Depression duration 4-5 months, also complains of being achy from playing tennis, fell down hurt foot tripping over puppy   5-6 months ago she started crying easily.  Denies any specific trigger or new stressor.  She doesn't feel lonely--son lives in town, has a good friend she sees daily, and has 2 cats and 2 dogs that keep her company.  She remains active, playting tennis. She is missing the things she used to do with her husband, and that her friends are still doing (such as traveling, going out to eat more.) She thinks the short-term memory loss has been a huge factor in her developing depression.  Normal appetite.  She has always been a good sleeper, but now is having trouble both falling asleep and staying asleep.  Problems with this in the last year.  She sometimes takes a nap during the day, but she has always done this, and it has never interfered with her sleep.  Feels sad, not so much hopeless.  Denies SI.  She stays active--is eating, getting dressed, getting out of the house. Her grandchildren were recently here visiting, and she enjoyed that.  She is reporting that her ears feel plugged/clogged, and of dry mouth.  No sniffling, sneezing, watery/itchy eyes.  Just some nose running when she is crying.  She was recently diagnosed with breast cancer in the right breast (biopsy last week).  She should be seeing radiation oncologist for consult.  She isn't particularly concerned or worried about this--knows that it was caught early, she isn't going to die from this, and doesn't seem to be worsening her mood.  2 weeks ago she was tripped by her puppy--fell backwards, hit her head slightly, and injured her right foot.  Right foot was swollen and bruised, but able to walk on it.  She has persistent pain at the proximal right foot, laterally.  Bruising has resolved. No numbness/tingling into the toes. Hasn't played tennis for 3-4 weeks  (stopped prior to family visiting, got busy, then injured her foot).  PMH, PSH, SH reviewed.  Outpatient Encounter Prescriptions as of 03/31/2015  Medication Sig Note  . acetaminophen (TYLENOL) 325 MG tablet Take 325 mg by mouth as needed for mild pain or moderate pain.  03/19/2015: .   . alendronate (FOSAMAX) 70 MG tablet Take 70 mg by mouth every 7 (seven) days.  03/19/2015: .   . aspirin 81 MG tablet Take 81 mg by mouth daily.   . calcium carbonate (OS-CAL) 600 MG TABS Take 600 mg by mouth daily.    . cholecalciferol (VITAMIN D) 1000 UNITS tablet Take 1,000 Units by mouth daily.   Marland Kitchen donepezil (ARICEPT) 10 MG tablet Take 10 mg by mouth at bedtime.   . fish oil-omega-3 fatty acids 1000 MG capsule Take 2 g by mouth daily.    Marland Kitchen glucosamine-chondroitin 500-400 MG tablet Take 2 tablets by mouth daily.    . Multiple Vitamins-Minerals (MULTIVITAMIN WITH MINERALS) tablet Take 1 tablet by mouth daily.   . naproxen sodium (ANAPROX) 220 MG tablet Take 220 mg by mouth as needed (pain).  03/19/2015: .   . omeprazole (PRILOSEC) 40 MG capsule Take 40 mg by mouth daily.  03/19/2015: .   . vitamin B-12 (CYANOCOBALAMIN) 100 MCG tablet Take 100 mcg by mouth daily.   . vitamin E 100 UNIT capsule Take 100 Units by mouth daily.   . Biotin 5000 MCG TABS Take 1 tablet by  mouth daily.   Marland Kitchen oxyCODONE-acetaminophen (PERCOCET) 10-325 MG per tablet Take 1 tablet by mouth every 6 (six) hours as needed for pain. (Patient not taking: Reported on 03/31/2015)    No facility-administered encounter medications on file as of 03/31/2015.   No Known Allergies  ROS: no fever, chills, headaches, dizziness, sinus pain, cough, shortness of breath.  ENT complaints as per HPI. Occasional diarrhea. No other bowel changes.  +insomnia and depression per HPI. She also complains of right hand pain/stiffness, and a little neck pain (muscular area)  PHYSICAL EXAM: BP 160/88 mmHg  Pulse 80  Wt 137 lb 9.6 oz (62.415 kg)  146/70 on repeat by  MD Well developed, pleasant female, in no distress. She is alert.  She admits easily when she doesn't know the answer to a question (about dates, when something happened, the name of a medication). Her moods and affect fluctuated during the visit.  She was laughing and making jokes, but also very tearful and sad during this visit.  She had normal hygiene, grooming, speech, and good eye contact. HEENT: PERRL, EOMI, conjunctiva clear. TM's and EAC's normal (minimal nonobstructive cerumen on the left). Nasal mucosa is minimally edematous, no drainage, erythema. OP is clear Neck: no lymphadenopathy, thyromegaly or mass Heart: regular rate and rhythm Lungs: clear bilaterally Extremities: 2+ pulses. No edema Tender over right 5th metatarsal, with some very mild soft tissue swelling. No pain with dorsixflexion or plantarflexion against resistance, full strength.  Some discomfort with plantarflexion/stretch Some arthritis changes noted in fingers of her right hand. No erythema, warmth, FROM  ASSESSMENT/PLAN:  Major depressive disorder, single episode, moderate - encouraged counseling; start lexapro.  f/u 4 weeks - Plan: TSH, escitalopram (LEXAPRO) 10 MG tablet  Insomnia - start trazadone.  if ineffective or not tolerated, change to Belsomra - Plan: traZODone (DESYREL) 50 MG tablet  Right foot pain - strain vs fracture--send for x-ray - Plan: DG Foot Complete Right  Atypical lobular hyperplasia of right breast - just had biopsy last week, and is actually breast cancer.  studies till pending.  Patient not anxious about this  Memory loss - patient mentioned she may want to see another neurologist, who spends more time durintg visits; ongoing issues with memory loss, contributing to her depression  OA hand--tylenol prn.  Continue glucosamine/chondroitin.  ROM exercises.  Depression:  Refer to Marya Amsler for counseling. Discussed importance of counseling as well as medication.  Risks/side effects of  lexapro reviewed in detail.  Start at 1/2 tablet, and increase to full tablet after a week if tolerated.  Insomnia: discussed both trazadone and Belsomra (will try and avoid ambien for now).  Will start trazadone first, and add lexapro a few days later, so as not to confuse any side effects.  55 minutes was spent face to face with this patient, addressing her many concerns and questions.  More than 1/2 spent counseling.  F/u 4 weeks

## 2015-03-31 NOTE — Patient Instructions (Addendum)
Please call Marya Amsler (card given) to set up counseling.  I think this is just as important as the medications we are prescribing.  Start Trazadone tonight for sleep.  Take just 1/2 tablet this evening, at bedtime.  If this is ineffective, but no side effects, try full tablet tomorrow.  Most people need 1-2 tablets each night, but some do fine on only 1/2 tablet.    For depression, we will be starting Lexapro.  Don't start both today, or we won't know which medication causes side effects.  The lexapro usually causes some headache, nausea and/or stomach upset, but it short-lived, usually improves after a few days.  You will start this in 3-4 days, depending on how you're doing with the trazadone.  Remember to start the Lexapro at just HALF TABLET.  Take this for a full week.  If your side effects haven't resolved by the end of the week, then continue it at 1/2 tablet daily.  If side effects are gone, then increase to the full tablet at the end of a week.  Go to [facility] at your convenience (generally between 8:30-4, no appointment needed) to get your foot x-ray. You can go to either location--[address] or [facility] (the first is near [place], [place], on that side of [address]; the latter is in the [facility] across from [place]).

## 2015-03-31 NOTE — Telephone Encounter (Signed)
Is it okay to refill Trazadone?

## 2015-04-01 ENCOUNTER — Telehealth: Payer: Self-pay | Admitting: *Deleted

## 2015-04-01 NOTE — Telephone Encounter (Signed)
Received referral from [facility].  Called pt and left a message for her to return my call so I can schedule her a med onc appt.

## 2015-04-02 ENCOUNTER — Telehealth: Payer: Self-pay | Admitting: *Deleted

## 2015-04-02 NOTE — Telephone Encounter (Signed)
Called patient to schedule appointment with Dr. Jana Hakim.  Confirmed appointment on 04/09/15 at 2:00 PM to arrive at 1:30 PM.  Patient verbalized understanding of appointment time and location.  Encouraged patient to call me for any questions.

## 2015-04-08 ENCOUNTER — Other Ambulatory Visit: Payer: Self-pay | Admitting: *Deleted

## 2015-04-08 DIAGNOSIS — D509 Iron deficiency anemia, unspecified: Secondary | ICD-10-CM

## 2015-04-08 DIAGNOSIS — N6091 Unspecified benign mammary dysplasia of right breast: Secondary | ICD-10-CM

## 2015-04-09 ENCOUNTER — Encounter: Payer: Self-pay | Admitting: Oncology

## 2015-04-09 ENCOUNTER — Other Ambulatory Visit (HOSPITAL_BASED_OUTPATIENT_CLINIC_OR_DEPARTMENT_OTHER): Payer: Medicare Other

## 2015-04-09 ENCOUNTER — Telehealth: Payer: Self-pay | Admitting: Oncology

## 2015-04-09 ENCOUNTER — Other Ambulatory Visit: Payer: Self-pay | Admitting: Oncology

## 2015-04-09 ENCOUNTER — Ambulatory Visit (HOSPITAL_BASED_OUTPATIENT_CLINIC_OR_DEPARTMENT_OTHER): Payer: Medicare Other | Admitting: Oncology

## 2015-04-09 ENCOUNTER — Ambulatory Visit: Payer: Medicare Other

## 2015-04-09 VITALS — BP 155/58 | HR 78 | Temp 97.8°F | Resp 18 | Ht 64.0 in | Wt 137.7 lb

## 2015-04-09 DIAGNOSIS — C50911 Malignant neoplasm of unspecified site of right female breast: Secondary | ICD-10-CM

## 2015-04-09 DIAGNOSIS — Z170 Estrogen receptor positive status [ER+]: Secondary | ICD-10-CM | POA: Diagnosis not present

## 2015-04-09 DIAGNOSIS — C50311 Malignant neoplasm of lower-inner quadrant of right female breast: Secondary | ICD-10-CM

## 2015-04-09 DIAGNOSIS — Z853 Personal history of malignant neoplasm of breast: Secondary | ICD-10-CM

## 2015-04-09 DIAGNOSIS — D509 Iron deficiency anemia, unspecified: Secondary | ICD-10-CM | POA: Diagnosis not present

## 2015-04-09 DIAGNOSIS — M810 Age-related osteoporosis without current pathological fracture: Secondary | ICD-10-CM

## 2015-04-09 DIAGNOSIS — R4189 Other symptoms and signs involving cognitive functions and awareness: Secondary | ICD-10-CM

## 2015-04-09 DIAGNOSIS — N6091 Unspecified benign mammary dysplasia of right breast: Secondary | ICD-10-CM

## 2015-04-09 DIAGNOSIS — C50912 Malignant neoplasm of unspecified site of left female breast: Secondary | ICD-10-CM

## 2015-04-09 LAB — COMPREHENSIVE METABOLIC PANEL (CC13)
ALBUMIN: 4.3 g/dL (ref 3.5–5.0)
ALT: 17 U/L (ref 0–55)
ANION GAP: 7 meq/L (ref 3–11)
AST: 20 U/L (ref 5–34)
Alkaline Phosphatase: 68 U/L (ref 40–150)
BUN: 14.9 mg/dL (ref 7.0–26.0)
CALCIUM: 9.4 mg/dL (ref 8.4–10.4)
CHLORIDE: 104 meq/L (ref 98–109)
CO2: 29 meq/L (ref 22–29)
Creatinine: 0.8 mg/dL (ref 0.6–1.1)
EGFR: 74 mL/min/{1.73_m2} — ABNORMAL LOW (ref >90)
GLUCOSE: 141 mg/dL — AB (ref 70–140)
POTASSIUM: 4 meq/L (ref 3.5–5.1)
Sodium: 140 mEq/L (ref 136–145)
Total Bilirubin: 0.74 mg/dL (ref 0.20–1.20)
Total Protein: 7 g/dL (ref 6.4–8.3)

## 2015-04-09 LAB — CBC WITH DIFFERENTIAL/PLATELET
BASO%: 1.2 % (ref 0.0–2.0)
Basophils Absolute: 0.1 10*3/uL (ref 0.0–0.1)
EOS ABS: 0.2 10*3/uL (ref 0.0–0.5)
EOS%: 3.5 % (ref 0.0–7.0)
HCT: 43 % (ref 34.8–46.6)
HEMOGLOBIN: 14.1 g/dL (ref 11.6–15.9)
LYMPH%: 19.1 % (ref 14.0–49.7)
MCH: 30.4 pg (ref 25.1–34.0)
MCHC: 32.9 g/dL (ref 31.5–36.0)
MCV: 92.5 fL (ref 79.5–101.0)
MONO#: 0.5 10*3/uL (ref 0.1–0.9)
MONO%: 6.5 % (ref 0.0–14.0)
NEUT%: 69.7 % (ref 38.4–76.8)
NEUTROS ABS: 4.9 10*3/uL (ref 1.5–6.5)
PLATELETS: 262 10*3/uL (ref 145–400)
RBC: 4.65 10*6/uL (ref 3.70–5.45)
RDW: 12.5 % (ref 11.2–14.5)
WBC: 7 10*3/uL (ref 3.9–10.3)
lymph#: 1.3 10*3/uL (ref 0.9–3.3)

## 2015-04-09 LAB — FERRITIN CHCC: FERRITIN: 43 ng/mL (ref 9–269)

## 2015-04-09 MED ORDER — ANASTROZOLE 1 MG PO TABS: 1.0000 mg | ORAL_TABLET | Freq: Every day | ORAL | Status: DC

## 2015-04-09 NOTE — Telephone Encounter (Signed)
Confirmed appointment for June & September.

## 2015-04-09 NOTE — Progress Notes (Signed)
[facility]  [phone] [phone]) [phone]     ID: AURIANA SCALIA DOB: 09-28-1941  MR#: [id number]  NTI#:[id number]  Patient Care Team: Rita Ohara, MD as PCP - General (Family Medicine) PCP: Vikki Ports, MD GYN: SU: Rolm Bookbinder MD OTHER MD: Thea Silversmith MD  CHIEF COMPLAINT:   CURRENT TREATMENT:    BREAST CANCER HISTORY: Evelyn White ("pronounced DOO[place]rren in [place], [place] in [place], [place] way in the [place]") has a remote history of left breast cancer (1996), for which she received surgery, radiation, and 5 years of tamoxifen.  More recently she had routine screening mammography at [facility] 02/18/2015, with tomosynthesis. The breast density was category B. There was a focal asymmetry in the right breast at the 5:00 position. Right diagnostic mammography with tomosynthesis 02/24/2015 confirmed a new area of focal asymmetric density in the lower inner quadrant of the right breast, which was not identified by ultrasound the same day. Core biopsy was obtained and showed atypical lobular hyperplasia. This was felt to be possibly discordant, and the patient proceeded to excisional biopsy 03/25/2015 showing ([id number]) an invasive lobular carcinoma, E-cadherin negative, measuring 4 mm. Margins were ample. The tumor was 90% estrogen receptor positive, with strong staining intensity, but progesterone receptor negative. The MIB-1 was 3%. HER-2 was not amplified, with a signals ratio of 1.72, and the number per cell 2.45.   Her case was presented at the multidisciplinary breast cancer conference 04/07/2015. At that time the consensus was no sentinel lymph node was necessary. Also that the patient would benefit from radiation. Anti-estrogens were to be considered Genetics referral was also recommended.  INTERVAL HISTORY: Evelyn White was evaluated in the breast clinic 04/09/2015 accompanied by her neighbor and friend Constance Holster.  REVIEW OF SYSTEMS: There were no specific symptoms  leading to the original mammogram, which was routinely scheduled. The patient denies unusual headaches, visual changes, nausea, vomiting, stiff neck, dizziness, or gait imbalance. There has been no cough, phlegm production, or pleurisy, no chest pain or pressure, and no change in bowel or bladder habits. The patient denies fever, rash, bleeding, unexplained fatigue or unexplained weight loss. She admits to significant problems with short-term memory. A detailed review of systems was otherwise entirely negative.  PAST MEDICAL HISTORY: Past Medical History  Diagnosis Date  . Breast cancer 1995    h/o left breast CA-s/p lumpectomy,radiation and Tamoxifen x 5years  . OA (osteoarthritis)     hands,feet  . Varicose veins   . Left knee pain     h/o-Dr.Collins  . BCC (basal cell carcinoma)     RLE-Dr.Lomax  . Colon polyps 5/06    hyperplastic  . Anemia 05/2009    iron deficient  . Gastric polyps 06/2009    small(Dr.Mann) EGD and Colonoscopy   . Osteoporosis     managed by Dr. Leo Grosser  . Breast cancer 1995    left  . GERD (gastroesophageal reflux disease)     PAST SURGICAL HISTORY: Past Surgical History  Procedure Laterality Date  . Upper gastrointestinal endoscopy  08/18/13    large hiatal hernia  . Tonsillectomy  age 21  . Breast lumpectomy  1995    left breast  . Varicose vein treatment  1990's    LLE  . Cataract extraction, bilateral  12/2013    Dr. Luretha Rued  . Radioactive seed guided excisional breast biopsy Right 03/25/2015    Procedure: RADIOACTIVE SEED GUIDED RIGHT EXCISIONAL BREAST BIOPSY;  Surgeon: Rolm Bookbinder, MD;  Location: [facility];  Service:  General;  Laterality: Right;    FAMILY HISTORY Family History  Problem Relation Age of Onset  . Hypertension Mother   . Osteoporosis Mother   . Alcohol abuse Son   . Heart disease Maternal Uncle   . Cancer Paternal Grandmother 29    breast cancer  . Diabetes Neg Hx   The patient's father died in his late 27s "from old  age". The patient's father's mother had a history of breast cancer late in life. The patient's own mother died at the age of 79. That is an only child. There is no history of ovarian cancer in the family to her knowledge.  GYNECOLOGIC HISTORY:  No LMP recorded. Patient is postmenopausal. Menarche age 42, first live birth age 45. She is GX P3. She does not recall when she went through the change of life but did take hormone replacement for some years.   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 She is widowed, and lives by herself with 2 dogs and 2 cats. Son Earnestine Mealing teaches photography in [place]. Son Christia Reading worked for a Hydrographic surveyor business in [place]. Son Frederico Hamman is a Games developer in [place]. The patient has 2 grandchildren. She is not a church attender    ADVANCED DIRECTIVES: The patient believes she has completed advanced directives and that she has named her son Earnestine Mealing as healthcare power of attorney. His wife, Goldye Tourangeau can be reached at [phone]   HEALTH MAINTENANCE: History  Substance Use Topics  . Smoking status: Former Smoker -- 0.50 packs/day for 5 years    Types: Cigarettes    Quit date: 10/23/1981  . Smokeless tobacco: Never Used  . Alcohol Use: Yes     Comment: 1.5 glasses of red wine daily     Colonoscopy:  PAP:  Bone density:  Lipid panel:  No Known Allergies  Current Outpatient Prescriptions  Medication Sig Dispense Refill  . acetaminophen (TYLENOL) 325 MG tablet Take 325 mg by mouth as needed for mild pain or moderate pain.     Marland Kitchen alendronate (FOSAMAX) 70 MG tablet Take 70 mg by mouth every 7 (seven) days.     Marland Kitchen aspirin 81 MG tablet Take 81 mg by mouth daily.    . Biotin 5000 MCG TABS Take 1 tablet by mouth daily.    . calcium carbonate (OS-CAL) 600 MG TABS Take 600 mg by mouth daily.     . cholecalciferol (VITAMIN D) 1000 UNITS tablet Take 1,000 Units by mouth daily.    Marland Kitchen donepezil (ARICEPT) 10 MG tablet Take 10 mg by mouth  at bedtime.    Marland Kitchen escitalopram (LEXAPRO) 10 MG tablet Take 1/2 tablet by mouth every morning.  Increase to full tablet after a week if tolerating 30 tablet 1  . fish oil-omega-3 fatty acids 1000 MG capsule Take 2 g by mouth daily.     Marland Kitchen glucosamine-chondroitin 500-400 MG tablet Take 2 tablets by mouth daily.     . Multiple Vitamins-Minerals (MULTIVITAMIN WITH MINERALS) tablet Take 1 tablet by mouth daily.    . naproxen sodium (ANAPROX) 220 MG tablet Take 220 mg by mouth as needed (pain).     Marland Kitchen omeprazole (PRILOSEC) 40 MG capsule Take 40 mg by mouth daily.     Marland Kitchen oxyCODONE-acetaminophen (PERCOCET) 10-325 MG per tablet Take 1 tablet by mouth every 6 (six) hours as needed for pain. 20 tablet 0  . traZODone (DESYREL) 50 MG tablet Start at 1/2 tablet by mouth at bedtime.  Increase  by 1/2 tablet every few days until effective, up to 2 tablets nightly 30 tablet 1  . vitamin B-12 (CYANOCOBALAMIN) 100 MCG tablet Take 100 mcg by mouth daily.    . vitamin E 100 UNIT capsule Take 100 Units by mouth daily.    Marland Kitchen anastrozole (ARIMIDEX) 1 MG tablet Take 1 tablet (1 mg total) by mouth daily. 90 tablet 4  . anastrozole (ARIMIDEX) 1 MG tablet Take 1 tablet (1 mg total) by mouth daily. 90 tablet 4   No current facility-administered medications for this visit.    OBJECTIVE: Older white woman who appears well Filed Vitals:   04/09/15 1411  BP: 155/58  Pulse: 78  Temp: 97.8 F (36.6 C)  Resp: 18     Body mass index is 23.62 kg/(m^2).    ECOG FS:0 - Asymptomatic  Ocular: Sclerae unicteric, pupils equal, round and reactive to light Ear-nose-throat: Oropharynx clear, dentition in good repair Lymphatic: No cervical or supraclavicular adenopathy Lungs no rales or rhonchi, good excursion bilaterally Heart regular rate and rhythm, no murmur appreciated Abd soft, nontender, positive bowel sounds MSK no focal spinal tenderness, no joint edema Neuro: non-focal, well-oriented, appropriate affect Breasts: The right  breast is status post recent lumpectomy. The incision is healing nicely, with no dehiscence, erythema, or swelling. The cosmetic result is excellent. There are no suspicious masses, and the right axilla is benign. The left breast is status post remote lumpectomy. I do not palpate any suspicious masses. The left axilla is benign.   LAB RESULTS:  CMP     Component Value Date/Time   NA 140 04/09/2015 1345   NA 138 03/25/2015 0943   K 4.0 04/09/2015 1345   K 4.2 03/25/2015 0943   CL 104 03/25/2015 0943   CO2 29 04/09/2015 1345   CO2 24 03/25/2015 0943   GLUCOSE 141* 04/09/2015 1345   GLUCOSE 106* 03/25/2015 0943   BUN 14.9 04/09/2015 1345   BUN 15 03/25/2015 0943   CREATININE 0.8 04/09/2015 1345   CREATININE 0.75 03/25/2015 0943   CREATININE 0.71 04/23/2014 1126   CALCIUM 9.4 04/09/2015 1345   CALCIUM 9.0 03/25/2015 0943   PROT 7.0 04/09/2015 1345   PROT 6.8 04/23/2014 1126   ALBUMIN 4.3 04/09/2015 1345   ALBUMIN 4.6 04/23/2014 1126   AST 20 04/09/2015 1345   AST 22 04/23/2014 1126   ALT 17 04/09/2015 1345   ALT 16 04/23/2014 1126   ALKPHOS 68 04/09/2015 1345   ALKPHOS 52 04/23/2014 1126   BILITOT 0.74 04/09/2015 1345   BILITOT 1.1 04/23/2014 1126   GFRNONAA >60 03/25/2015 0943   GFRAA >60 03/25/2015 0943    INo results found for: SPEP, UPEP  Lab Results  Component Value Date   WBC 7.0 04/09/2015   NEUTROABS 4.9 04/09/2015   HGB 14.1 04/09/2015   HCT 43.0 04/09/2015   MCV 92.5 04/09/2015   PLT 262 04/09/2015      Chemistry      Component Value Date/Time   NA 140 04/09/2015 1345   NA 138 03/25/2015 0943   K 4.0 04/09/2015 1345   K 4.2 03/25/2015 0943   CL 104 03/25/2015 0943   CO2 29 04/09/2015 1345   CO2 24 03/25/2015 0943   BUN 14.9 04/09/2015 1345   BUN 15 03/25/2015 0943   CREATININE 0.8 04/09/2015 1345   CREATININE 0.75 03/25/2015 0943   CREATININE 0.71 04/23/2014 1126      Component Value Date/Time   CALCIUM 9.4 04/09/2015 1345   CALCIUM  9.0  03/25/2015 0943   ALKPHOS 68 04/09/2015 1345   ALKPHOS 52 04/23/2014 1126   AST 20 04/09/2015 1345   AST 22 04/23/2014 1126   ALT 17 04/09/2015 1345   ALT 16 04/23/2014 1126   BILITOT 0.74 04/09/2015 1345   BILITOT 1.1 04/23/2014 1126       No results found for: LABCA2  No components found for: LABCA125  No results for input(s): INR in the last 168 hours.  Urinalysis    Component Value Date/Time   BILIRUBINUR neg 04/23/2014 0958   PROTEINUR neg 04/23/2014 0958   UROBILINOGEN negative 04/23/2014 0958   NITRITE neg 04/23/2014 0958   LEUKOCYTESUR Trace 04/23/2014 0958    STUDIES: Dg Foot Complete Right  04-23-2015   CLINICAL DATA:  [place] over dog 10 days ago. Lingering pain across right foot.  EXAM: RIGHT FOOT COMPLETE - 3+ VIEW  COMPARISON:  None.  FINDINGS: There is a linear lucency adjacent to the calcaneal cuboid joint along the lateral aspect of the midfoot which is concerning for a an avulsion injury. Additionally, there is a small lucency through the distal aspect of the cuboid bone. This is likely this is of doubtful clinical significance.  IMPRESSION: 1. Avulsion injury arising from the lateral aspect of the midfoot tarsal bones. 2. Equivocal linear lucency within the distal aspect of the cuboid bone. If there is a concern for on additional injury to this area then an MRI or CT may provide a more specific and sensitive assessment for internal cuboid bone fracture.   Electronically Signed   By: Kerby Moors M.D.   On: 04/23/15 14:15    ASSESSMENT: 74 y.o. [place] woman status post right lumpectomy 03/25/2015 for a pT1a NX invasive lobular carcinoma, estrogen receptor 90% positive, progesterone receptor negative, with an MIB-1 of 3%, and no HER-2 amplification.  (1) genetics referral pending  (2) consider adjuvant radiation to follow surgery  (3) consider anti-estrogens to follow radiation.  PLAN: We spent the better part of today's hour-long appointment discussing  the biology of breast cancer in general, and the specifics of the patient's tumor in particular.  Evelyn White understands she had a very small, nonaggressive-looking, slow-growing invasive lobular breast cancer. Her prognosis would still be very good even if she had no further treatment. On the other hand this is her second cancer, and it is lobular, so it is reasonable to do something more.  She understands she does not qualify for anti-HER-2 treatment and we never use chemotherapy for tumors smaller than 5 mm. She took tamoxifen for 5 years previously, so in this setting I would recommend anastrozole for 5 years. We discussed the possible toxicities, side effects and complications of this agent.   She understands that  If she does take an antiestrogen for 5 years, adjuvant radiation would not affect survival. On the other hand it will affect local recurrence to some extent. She will be meeting with Dr. Pablo Ledger next week to discuss those numbers.  At this point she is leaning strongly towards no radiation , only anti-estrogens, but I urged her to meet with Dr. Pablo Ledger in any case and get all the information before making a final decision. If she does not receive radiation, then she can start the anastrozole at any time. Otherwise I would prefer she wait until the radiation as been completed.  We discussed genetics. She does qualify for testing. She understands that particularly this would be helpful to her family if a deleterious mutation is found.  We have gone ahead and placed the referral in for her.  She will return to see me in approximately 3 months. She knows to call for any problems that may develop before then.  Evelyn White has a good understanding of the overall plan. She agrees with it. She knows the goal of treatment in her case is cure. She will call with any problems that may develop before her next visit here.  Chauncey Cruel, MD   04/10/2015 10:05 AM Medical Oncology and Hematology [facility] [address] [place], [place] [address] Tel. [phone]    Fax. [phone]

## 2015-04-10 DIAGNOSIS — C50911 Malignant neoplasm of unspecified site of right female breast: Secondary | ICD-10-CM | POA: Insufficient documentation

## 2015-04-10 DIAGNOSIS — R4189 Other symptoms and signs involving cognitive functions and awareness: Secondary | ICD-10-CM | POA: Insufficient documentation

## 2015-04-10 DIAGNOSIS — C50912 Malignant neoplasm of unspecified site of left female breast: Secondary | ICD-10-CM | POA: Insufficient documentation

## 2015-04-14 NOTE — Progress Notes (Signed)
Location of Breast Cancer:Right invasive lobular carcinoma.  Histology per Pathology Report:03/25/15 NAL DIAGNOSIS Diagnosis Breast, lumpectomy, Right - INVASIVE MAMMARY CARCINOMA, 0.4 CM. - MARGINS NOT INVOLVED. - CLOSEST MARGIN POSTERIOR AT 0.4 CM. - BIOPSY SITE REACTION WITH ASSOCIATED INFLAMMATION.  Receptor Status: ER(+), PR (-), Her2-neu (-)  Did patient present with symptoms (if so, please note symptoms) or was this found on screening mammography?:Found on screening mammogram.  Past/Anticipated interventions by surgeon, if any:03/25/15 [place]  Past/Anticipated interventions by medical oncology, if any: Chemotherapy not reccommended. Plan anastrozole for 5 years. Genetics referral.  Lymphedema issues, if any:No  Pain issues, if any:No  SAFETY ISSUES:  Prior radiation? Yes,completed treatment August 1995 via direction of Dr.James Kinard left breast 5940 cGy  Pacemaker/ICD? No  Possible current pregnancy? n/a. Post-menopausal  Is the patient on methotrexate? no  Current Complaints / other details:WidowedMenarche 14. First live birth age 55.Davie P3  Paternal grandmother  Diagnosed breast cancer age 2. Short-term memory loss. Quit smoking  January 1983.    Arlyss Repress, RN 04/14/2015,11:18 AM  BP 147/51 mmHg  Pulse 71  Temp(Src) 97.9 F (36.6 C)  Wt 139 lb 1.6 oz (63.095 kg)

## 2015-04-15 ENCOUNTER — Ambulatory Visit
Admission: RE | Admit: 2015-04-15 | Discharge: 2015-04-15 | Disposition: A | Discharge status: Home / Self Care | Payer: Medicare Other | Source: Ambulatory Visit | Attending: Radiation Oncology | Admitting: Radiation Oncology

## 2015-04-15 ENCOUNTER — Encounter: Payer: Self-pay | Admitting: Radiation Oncology

## 2015-04-15 VITALS — BP 147/51 | HR 71 | Temp 97.9°F | Wt 139.1 lb

## 2015-04-15 DIAGNOSIS — C50911 Malignant neoplasm of unspecified site of right female breast: Secondary | ICD-10-CM

## 2015-04-15 DIAGNOSIS — Z79890 Hormone replacement therapy (postmenopausal): Secondary | ICD-10-CM | POA: Insufficient documentation

## 2015-04-15 NOTE — Progress Notes (Signed)
Radiation Oncology         [phone] ________________________________  Initial Outpatient Consultation - Date: 04/15/2015   Name: Evelyn White MRN: [id number]   DOB: 07-26-1941  REFERRING PHYSICIAN: Rolm Bookbinder, MD  DIAGNOSIS AND STAGE: Breast cancer, left breast   Staging form: Breast, AJCC 7th Edition     Clinical: Stage Unknown (TX, N0, M0) - Signed by Chauncey Cruel, MD on 04/10/2015 Breast cancer, right breast   Staging form: Breast, AJCC 7th Edition     Clinical: Stage IA (T1a, N0, M0) - Signed by Chauncey Cruel, MD on 04/10/2015   HISTORY OF PRESENT ILLNESS:Evelyn White is a 74 y.o. female presenting to clinic with cancer of the left female breast. The patient underwent a mammogram on 02/18/2015. A core biopsy was performed and exposed atypical lobular hyperplasia. This was felt to be discordant, and the patient proceeded to excisional biopsy 03/25/2015 showing ([id number]) an invasive lobular carcinoma, E-cadherin negative, measuring 25mm. The tumor was 90% ER positive, but PR negative. At this time, no sentinel lymph node was performed. Margins were negative. The patient's case was presented at the multidisciplinary breast cancer conference on 04/07/2015. At that time the consensus was no sentinel lymph node was necessary. She expierenced menarche at 74 years of age and experienced her first live childbirth at 74 years of age. She is GXP3 and has administered hormone replacement in the past (but does not currently take hormone replacement). She is referred for consideration of radiation in the management of her disease.   PREVIOUS RADIATION THERAPY: Yes  Past medical, social and family history were reviewed in the electronic chart. Review of symptoms was reviewed in the electronic chart. Medications were reviewed in the electronic chart. The patient has a history of breast cancer in 1996 with surgery, radiation, and five years of tamoxifen provided  as treatment.  PHYSICAL EXAM:  Filed Vitals:   04/15/15 1046  BP: 147/51  Pulse: 71  Temp: 97.9 F (36.6 C)  .139 lb 1.6 oz (63.095 kg). Pleasant female in no distress. Incision healing well over right breast. Multiple tattoos over left breast consistent with prior radiation fields. No palpable cervical, supraclavicular or axillary adenopathy.   IMPRESSION: Evelyn White is a 74 year old patient presenting to clinic in regards to her history of breast cancer of the left breast.   PLAN: We discussed the elderly trials showing no survival benefit to radiation plus anti-estrogen therapy versus anti-estrogen alone.   We discussed the role of radiation in decreasing local failures in patients who undergo lumpectomy. We discussed the low rates of failure in patients her age with these non aggressive breast cancers. We discussed the randomized trials in elderly women showing equivalent survival when omitting raidiaton and taking an AI instead. We discussed the randomized trials showing a slight improvement in local control with the combination. We discussed the differences between systemic and local treatment. We discussed the process of simulation and the placement tattoos.  We discussed the possibility of asymptomatic lung damage. We discussed the low likelihood of secondary malignancies. We discussed the possible side effects including but not limited to skin redness, fatigue, permanent skin darkening, and breast swelling.   We discussed treatment of her lymph nodes with radiation as she has not had a sentinel lymph node biopsy.  We discussed that anti-estrogens alone are not typically used to treat the axilla and that patients on the elderly trials had lymph node evaluation.   She is leaning towards  antiestrogen treatment alone which is reasonable. She is going to talk to her family and call me if she would like to pursue radiation instead.   I spent 40 minutes  face to face with the patient and  more than 50% of that time was spent in counseling and/or coordination of care.   This document serves as a record of services personally performed by Thea Silversmith , MD. It was created on her behalf by Lenn Cal, a trained medical scribe. The creation of this record is based on the scribe's personal observations and the provider's statements to them. This document has been checked and approved by the attending provider.   ------------------------------------------------  Thea Silversmith, MD

## 2015-04-15 NOTE — Progress Notes (Signed)
Please see the Nurse Progress Note in the MD Initial Consult Encounter for this patient. 

## 2015-04-15 NOTE — Addendum Note (Signed)
Encounter addended by: Norm Salt, RN on: 04/15/2015 12:13 PM<BR>     Documentation filed: Charges VN

## 2015-04-20 ENCOUNTER — Encounter: Payer: Self-pay | Admitting: Genetic Counselor

## 2015-04-20 ENCOUNTER — Ambulatory Visit (HOSPITAL_BASED_OUTPATIENT_CLINIC_OR_DEPARTMENT_OTHER): Payer: Medicare Other | Admitting: Genetic Counselor

## 2015-04-20 ENCOUNTER — Other Ambulatory Visit: Payer: Self-pay | Admitting: Neurology

## 2015-04-20 ENCOUNTER — Other Ambulatory Visit: Payer: Medicare Other

## 2015-04-20 DIAGNOSIS — C50911 Malignant neoplasm of unspecified site of right female breast: Secondary | ICD-10-CM

## 2015-04-20 DIAGNOSIS — Z315 Encounter for genetic counseling: Secondary | ICD-10-CM | POA: Diagnosis not present

## 2015-04-20 DIAGNOSIS — Z803 Family history of malignant neoplasm of breast: Secondary | ICD-10-CM | POA: Diagnosis not present

## 2015-04-20 DIAGNOSIS — Z853 Personal history of malignant neoplasm of breast: Secondary | ICD-10-CM

## 2015-04-20 DIAGNOSIS — C50311 Malignant neoplasm of lower-inner quadrant of right female breast: Secondary | ICD-10-CM

## 2015-04-20 NOTE — Progress Notes (Signed)
REFERRING PROVIDER: Lurline Del, MD  PRIMARY PROVIDER:  Vikki Ports, MD  PRIMARY REASON FOR VISIT:  1. Breast cancer, right   2. History of breast cancer   3. Family history of breast cancer in female      HISTORY OF PRESENT ILLNESS:   [name], a 74 y.o. female, was seen for a [facility] cancer genetics consultation at the request of Dr. Jana Hakim due to a personal history of multiple breast cancers and a paternal family history of breast cancer.  [name] presents to clinic today to discuss the possibility of a hereditary predisposition to cancer, genetic testing, and to further clarify her future cancer risks, as well as potential cancer risks for family members.   In 2016, at the age of 50, [name] was diagnosed with invasive lobular carcinoma of the right breast. The hormone receptor status was ER+, PR-, and Her2-.  This was treated with right lumpectomy and will then be treated with either anti-estrogen therapy or radiation.  [name] has a history of left breast cancer in 28 at the age of 36-53.  At the time, this was treated with surgery, radiation, and five years of tamoxifen.  CANCER HISTORY:   No history exists.   2016 - ILC of right breast, ER+, PR-, Her2- 1995 - Cancer of the left breast; treated with surgery, radiation, and 5 years tamoxifen   HORMONAL RISK FACTORS:  Menarche was at age 64.  First live birth at age approximately 89.  OCP use for approximately approximately 1 year years.  Ovaries intact: yes.  Hysterectomy: no.  Menopausal status: postmenopausal.  HRT use: for a number of  years. Colonoscopy: yes; hx of hyperplastic polyps in 2006; gastric polyp found in 2009. Mammogram within the last year: yes. Number of breast biopsies: 2. Up to date with pelvic exams:  yes. Any excessive radiation exposure in the past:  no  Past Medical History  Diagnosis Date  . OA (osteoarthritis)     hands,feet  . Varicose veins   . Left knee pain    h/o-Dr.Collins  . BCC (basal cell carcinoma)     RLE-Dr.Lomax  . Colon polyps 5/06    hyperplastic  . Anemia 05/2009    iron deficient  . Gastric polyps 06/2009    small(Dr.Mann) EGD and Colonoscopy   . Osteoporosis     managed by Dr. Leo Grosser  . GERD (gastroesophageal reflux disease)   . Breast cancer 1995    h/o left breast CA-s/p lumpectomy,radiation and Tamoxifen x 5years  . Breast cancer 2016    ILC of right breast    Past Surgical History  Procedure Laterality Date  . Upper gastrointestinal endoscopy  08/18/13    large hiatal hernia  . Tonsillectomy  age 45  . Breast lumpectomy  1995    left breast  . Varicose vein treatment  1990's    LLE  . Cataract extraction, bilateral  12/2013    Dr. Luretha Rued  . Radioactive seed guided excisional breast biopsy Right 03/25/2015    Procedure: RADIOACTIVE SEED GUIDED RIGHT EXCISIONAL BREAST BIOPSY;  Surgeon: Rolm Bookbinder, MD;  Location: [facility];  Service: General;  Laterality: Right;    History   Social History  . Marital Status: Widowed    Spouse Name: N/A  . Number of Children: 3  . Years of Education: N/A   Occupational History  . 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 Smoking status: Former  Smoker -- 0.50 packs/day for 5 years    Types: Cigarettes    Quit date: 10/23/1981  . Smokeless tobacco: Never Used  . Alcohol Use: 0.0 oz/week    0 Standard drinks or equivalent per week     Comment: 1.5 glasses of red wine daily  . Drug Use: No  . Sexual Activity: Not on file   Other Topics Concern  . Not on file   Social History Narrative   Widowed (spring 2014), lives with 2 cats, 2 dogs.  Has 3 sons, one with alcoholism (did well in rehab at [facility]), 1 in [place], 1 in [place]. 2 granddaughters     FAMILY HISTORY:  We obtained a detailed, 4-generation family history.  Significant diagnoses are listed below: Family History  Problem Relation Age of Onset  . Hypertension  Mother   . Osteoporosis Mother   . Alcohol abuse Son   . Heart disease Maternal Uncle   . Cancer Paternal Grandmother 47    breast cancer  . Diabetes Neg Hx   . Breast cancer Paternal Aunt     dx. age unknown; lim info    [name] has three three sons and two granddaughters for whose benefit she is primarily seeking genetic testing.  Her sons are between the ages of 36 and 59, and have not had cancer.  [name] herself is an only child.  Neither of her parents have had cancer, and both have passed away--her mother at the age of 41 and her father at 49.  She has limited information regarding her six maternal uncles and one maternal aunt.  She does not believe that any of her maternal cousins have had cancer, but she is not in touch with them.  Her maternal grandparents were cancer-free--her grandmother passed away at the age of 33 and her maternal grandfather had heart issues and passed away at the age of 23.  Ms. Demattia thinks that one paternal aunt might have had breast cancer, but she is not sure at what age she would have been diagnosed.  She has no further information regarding her other paternal aunt, paternal uncle, or any of her paternal cousins.  Her paternal grandmother was diagnosed with breast cancer at 25.  Her paternal grandfather passed away in his mid-60s, but did not have cancer to her knowledge.  Patient's maternal ancestors are of [place] descent, and paternal ancestors are of [place], [place], [place], and [place] descent. There is no reported Ashkenazi Jewish ancestry. There is no known consanguinity.  GENETIC COUNSELING ASSESSMENT: [name] is a 74 y.o. female with a personal history of multiple breast cancer primaries and family history of breast cancer which somewhat suggestive of a hereditary breast cancer syndrome and predisposition to cancer. We, therefore, discussed and recommended the following at today's visit.   DISCUSSION: We reviewed the characteristics,  features and inheritance patterns of hereditary breast cancer syndromes, specifically that caused by changes within the BRCA1 and BRCA2 genes. We also discussed genetic testing, including the appropriate family members to test, the process of testing, insurance coverage and turn-around-time for results. We discussed the implications of a negative, positive and/or variant of uncertain significant result. We discussed the option of smaller and larger panels that consider genes related to increased risks for breast and other cancers.  Ms. Battle expressed her desire to have genetic testing for a smaller panel of genes related to increased risks for breast cancer.  We recommended Ms. Ripberger pursue genetic testing for  the 9-gene Breast Cancer High/Moderate Risk panel through [facility] [name], MD).   Based on Ms. Wisler's personal and family history of cancer, she meets medical criteria for genetic testing. Despite that she meets criteria, she may still have an out of pocket cost. We discussed that if her out of pocket cost for testing is over $100, the laboratory will call and confirm whether she wants to proceed with testing.  If the out of pocket cost of testing is less than $100 she will be billed by the genetic testing laboratory.   PLAN: After considering the risks, benefits, and limitations, Ms. Meleski  provided informed consent to pursue genetic testing and the blood sample was sent to [facility] for analysis of the Breast Cancer High/Moderate Risk Panel. The Breast Cancer High/Moderate Risk gene panel offered by [facility] includes next-generation sequencing and deletion/duplication analysis of the following 9 genes: ATM, BRCA1, BRCA2, CDH1, CHEK2, PALB2, PTEN, STK11, and TP53.  Results should be available within approximately 2-3 weeks' time, at which point they will be disclosed by telephone to Ms. Leopard, as will any additional recommendations warranted by these results. Ms. Rozeboom will  receive a summary of her genetic counseling visit and a copy of her results once available. This information will also be available in Epic. We encouraged Ms. Yonts to remain in contact with cancer genetics annually so that we can continuously update the family history and inform her of any changes in cancer genetics and testing that may be of benefit for her family. Ms. Tatlock questions were answered to her satisfaction today. Our contact information was provided should additional questions or concerns arise.  Thank you for the referral and allowing [place] to share in the care of your patient.   Jeanine Luz, MS Genetic Counselor [email or web address] phone: [phone]  The patient was seen for a total of 40 minutes in face-to-face genetic counseling.  This patient was discussed with Drs. Magrinat, Lindi Adie and/or Burr Medico who agrees with the above.    _______________________________________________________________________ For Office Staff:  Number of people involved in session: 1 Was an Intern/ student involved with case: no

## 2015-04-21 DIAGNOSIS — Z853 Personal history of malignant neoplasm of breast: Secondary | ICD-10-CM | POA: Diagnosis not present

## 2015-04-21 DIAGNOSIS — Z803 Family history of malignant neoplasm of breast: Secondary | ICD-10-CM | POA: Diagnosis not present

## 2015-04-21 DIAGNOSIS — C50911 Malignant neoplasm of unspecified site of right female breast: Secondary | ICD-10-CM | POA: Diagnosis not present

## 2015-04-21 NOTE — Telephone Encounter (Signed)
Patient has not been seen since last June.  Ok to give #30 and ask her to make an appointment?

## 2015-04-22 DIAGNOSIS — M810 Age-related osteoporosis without current pathological fracture: Secondary | ICD-10-CM | POA: Diagnosis not present

## 2015-04-29 ENCOUNTER — Encounter: Payer: Self-pay | Admitting: Family Medicine

## 2015-04-29 ENCOUNTER — Ambulatory Visit (INDEPENDENT_AMBULATORY_CARE_PROVIDER_SITE_OTHER): Payer: Medicare Other | Admitting: Family Medicine

## 2015-04-29 VITALS — BP 116/64 | HR 72 | Temp 97.6°F | Wt 138.0 lb

## 2015-04-29 DIAGNOSIS — F321 Major depressive disorder, single episode, moderate: Secondary | ICD-10-CM

## 2015-04-29 DIAGNOSIS — C50911 Malignant neoplasm of unspecified site of right female breast: Secondary | ICD-10-CM

## 2015-04-29 DIAGNOSIS — J309 Allergic rhinitis, unspecified: Secondary | ICD-10-CM | POA: Diagnosis not present

## 2015-04-29 DIAGNOSIS — M2550 Pain in unspecified joint: Secondary | ICD-10-CM

## 2015-04-29 NOTE — Patient Instructions (Addendum)
   Allergies--if your symptoms are mild and not bothersome, you don't HAVE to take anything for them.  If your symptoms are bothersome, then I recommend taking an antihistamine such as Claritin or Allegra or Zyrtec once daily.  If you have a dry annoying cough from the postnasal drainage, that continues even with the antihistamine, then add guaifenesin (found in Mucinex or Robitussin). If your congestion is worse, or not helped by the antihistamine (nasal congestion, any ear plugging, still lots of postnasal drainage) then consider adding a nasal steroid daily such as Flonase or Nasacort or Rhinocort. These all used to be prescription medications, but are now available over the counter. This needs to be use regularly for the allergy season (every day) in order for it to be effective.  Depression:  I encourage you to contact Marya Amsler (I have given you her card) to arrange for counseling.  If your moods aren't improving over the next 2-4 weeks (while continuing to take the escitalopram (generic Lexapro)), then we may need to increase your dose of medication. I do think that your moods might improve once you are able to resume playing tennis. Continue the Lexapro 10mg  daily.  Continue the Trazadone if needed for sleep.  If your sleep is improving as the depression is treated with the other medication (lexapro), you may not truly need the trazadone every day, long-term.  It is safe to continue to use it daily (but you might want to test it at some point, to see if you really need it every day).   Arthritis pain--in general, please use Tylenol (Tylenol Arthritis is a little longer acting than the regular tylenol) for more chronic aches and pains from arthritis.  Use the aleve only if tylenol hasn't helped, or with an acute injury or sprain/strain (if you have swelling).  The Aleve is an anti-inflammatory (where as Tylenol just treats pain, not inflammation), but if taken daily, it can affect your stomach (and  increase the risk for irritation and ulcers) and affect your kidneys.  So, use this sparingly, trying tylenol first. Your foot pain seems to have improved significantly--you are no longer tender on exam.  I think you are okay to try playing tennis again--start out slowly, advance as tolerated. Ice and elevate the foot if it is sore or swollen after playing.  Your new cancer medication, Arimidex (anastrozole) does have a side effect of causing joint pains.  If you have ongoing issues with severe joint pains, then talk to your oncologist about this. In the meantime, use tylenol and/or aleve when needed.

## 2015-04-29 NOTE — Progress Notes (Signed)
Chief Complaint  Patient presents with  . foot pain    foot pain for a while and swelling. having some knee pain with it.   . throat    throat- clearing of the throat alot. might be allergies she thinks.     Yesterday both of her knees and her right foot were hurting her.  Everything was aching yesterday. She has been having right foot pain for 6-8 weeks, since she tripped (related to her puppy).  Knees have off and on pain, bad was really bad yesterday, fine today.  She hasn't played tennis in about 2 months due to foot injury (and out of town guests prior to that), only exercise has been walking the dogs. She took 2 Aleve yesterday, and pain is better today.  Doesn't think she has been using any tylenol recently.  She was started on Arimidex a few weeks ago for her breast cancer.   F/u depression and insomnia:  She didn't schedule this visit to discuss her moods, but she was supposed to fu in 4-6 weeks (and never made an appointment, so being addressed today).  She has been on the lexapro and trazadone for a month.  She is sleeping better since taking the trazadone at bedtime (increased to full tablet after 1/2 was not effective).  No side effects.  She is a little tired still in the morning, maybe because the dogs wake her up, and she takes a quick nap after walking them, about 15 minutes, and is fine the rest of the day. She doesn't note any significant improvement in her moods since being on the lexapro.  No headaches, nausea or other side effects.  She never called Marya Amsler for counseling--doesn't recall this, doesn't have her card (lost it).  In the last 2 weeks she has been clearing her throat more, along with runny nose and watery eyes.  It isn't bad enough for her to take another medication.  Voice is sometimes scratchy. H/o allergies when she lived on [place], just intermittent problems in the summer, through September more recently. She also has some intermittent ear  plugging/popping.  Denies fever, sinus pain, discolored mucus, significant cough  PMH, PSH, SH reviewed. Outpatient Encounter Prescriptions as of 04/29/2015  Medication Sig Note  . alendronate (FOSAMAX) 70 MG tablet Take 70 mg by mouth every 7 (seven) days.  03/19/2015: .   . anastrozole (ARIMIDEX) 1 MG tablet Take 1 tablet (1 mg total) by mouth daily.   Marland Kitchen aspirin 81 MG tablet Take 81 mg by mouth daily.   . Biotin 5000 MCG TABS Take 1 tablet by mouth daily.   . calcium carbonate (OS-CAL) 600 MG TABS Take 600 mg by mouth daily.    . cholecalciferol (VITAMIN D) 1000 UNITS tablet Take 1,000 Units by mouth daily.   Marland Kitchen donepezil (ARICEPT) 10 MG tablet Take 10 mg by mouth at bedtime.   . donepezil (ARICEPT) 10 MG tablet Take 1 tablet daily   . escitalopram (LEXAPRO) 10 MG tablet Take 1/2 tablet by mouth every morning.  Increase to full tablet after a week if tolerating   . fish oil-omega-3 fatty acids 1000 MG capsule Take 2 g by mouth daily.    Marland Kitchen glucosamine-chondroitin 500-400 MG tablet Take 2 tablets by mouth daily.    . Multiple Vitamins-Minerals (MULTIVITAMIN WITH MINERALS) tablet Take 1 tablet by mouth daily.   . naproxen sodium (ANAPROX) 220 MG tablet Take 220 mg by mouth as needed (pain).  03/19/2015: .   Marland Kitchen  omeprazole (PRILOSEC) 40 MG capsule Take 40 mg by mouth daily.  03/19/2015: .   . traZODone (DESYREL) 50 MG tablet Start at 1/2 tablet by mouth at bedtime.  Increase by 1/2 tablet every few days until effective, up to 2 tablets nightly   . vitamin B-12 (CYANOCOBALAMIN) 100 MCG tablet Take 100 mcg by mouth daily.   . vitamin E 100 UNIT capsule Take 100 Units by mouth daily.   Marland Kitchen acetaminophen (TYLENOL) 325 MG tablet Take 325 mg by mouth as needed for mild pain or moderate pain.  03/19/2015: .   . anastrozole (ARIMIDEX) 1 MG tablet Take 1 tablet (1 mg total) by mouth daily.   Marland Kitchen oxyCODONE-acetaminophen (PERCOCET) 10-325 MG per tablet Take 1 tablet by mouth every 6 (six) hours as needed for pain.  (Patient not taking: Reported on 04/29/2015)    No facility-administered encounter medications on file as of 04/29/2015.   No Known Allergies ROS:  No fever, chills, URI symptoms, just the congestion/allergy symptoms as per HPI.  No headaches, dizziness, chest pain, palpitations.  No nausea, vomiting, bowel changes, bleeding, bruising.  +depresson; improved insomnia.  Knee pain is better today; right foot pain is improved.  +hand arthritis.  PHYSICAL EXAM: BP 116/64 mmHg  Pulse 72  Temp(Src) 97.6 F (36.4 C) (Tympanic)  Wt 138 lb (62.596 kg)  Well developed, pleasant female in no distress. She is pleasant, has difficulty with giving details of the history (having to do with medications, length of symptoms, etc) She is alert and oriented, with normal cranial nerves, gait, strength HEENT: PERRL, EOMI, conjunctiva clear.  TM's and EAC's--normal on the right, partially obscured by cerumen on the left.  Nasal mucosa is mildly edematous with some clear mucus. No erythema or purulence.  OP is clear. Sinuses nontender Neck:No lymphadenopathy, thyromegaly or mass Heart: regular rate and rhythm without murmur Lungs: clear bilaterally Knees: no erythema, warmth, swelling, FROM. No tenderness Right foot--normal pulses.  No body tenderness (previous exam showed tenderness on 5th metatarsal). Skin: no rashes, bruising  ASSESSMENT/PLAN:  Arthralgia - possibly related to arimidex. tylenol and/or aleve prn. f/u with oncologist if persistent arthralgias. Foot pain improved--may resume tennis  Major depressive disorder, single episode, moderate - continue Lexapro; encouraged to schedule appointent with therapist. f/u 1 month  Breast cancer, right breast  Allergic rhinitis, unspecified allergic rhinitis type - OTC medications reviewed in detail   Allergies--if your symptoms are mild and not bothersome, you don't HAVE to take anything for them.  If your symptoms are bothersome, then I recommend taking an  antihistamine such as Claritin or Allegra or Zyrtec once daily.  If you have a dry annoying cough from the postnasal drainage, that continues even with the antihistamine, then add guaifenesin (found in Mucinex or Robitussin). If your congestion is worse, or not helped by the antihistamine (nasal congestion, any ear plugging, still lots of postnasal drainage) then consider adding a nasal steroid daily such as Flonase or Nasacort or Rhinocort. These all used to be prescription medications, but are now available over the counter. This needs to be use regularly for the allergy season (every day) in order for it to be effective.  Depression:  I encourage you to contact Marya Amsler (I have given you her card) to arrange for counseling.  If your moods aren't improving over the next 2-4 weeks (while continuing to take the escitalopram (generic Lexapro)), then we may need to increase your dose of medication. I do think that your moods might  improve once you are able to resume playing tennis. Continue the Lexapro 10mg  daily.  Continue the Trazadone if needed for sleep.  If your sleep is improving as the depression is treated with the other medication (lexapro), you may not truly need the trazadone every day, long-term.  It is safe to continue to use it daily (but you might want to test it at some point, to see if you really need it every day).   Arthritis pain--in general, please use Tylenol (Tylenol Arthritis is a little longer acting than the regular tylenol) for more chronic aches and pains from arthritis.  Use the aleve only if tylenol hasn't helped, or with an acute injury or sprain/strain (if you have swelling).  The Aleve is an anti-inflammatory (where as Tylenol just treats pain, not inflammation), but if taken daily, it can affect your stomach (and increase the risk for irritation and ulcers) and affect your kidneys.  So, use this sparingly, trying tylenol first. Your foot pain seems to have improved  significantly--you are no longer tender on exam.  I think you are okay to try playing tennis again--start out slowly, advance as tolerated. Ice and elevate the foot if it is sore or swollen after playing.  Your new cancer medication, Arimidex (anastrozole) does have a side effect of causing joint pains.  If you have ongoing issues with severe joint pains, then talk to your oncologist about this. In the meantime, use tylenol and/or aleve when needed.  F/u 1 month

## 2015-05-03 ENCOUNTER — Encounter: Payer: Self-pay | Admitting: Genetic Counselor

## 2015-05-03 ENCOUNTER — Telehealth: Payer: Self-pay | Admitting: Genetic Counselor

## 2015-05-03 DIAGNOSIS — C50911 Malignant neoplasm of unspecified site of right female breast: Secondary | ICD-10-CM

## 2015-05-03 DIAGNOSIS — Z803 Family history of malignant neoplasm of breast: Secondary | ICD-10-CM | POA: Insufficient documentation

## 2015-05-03 DIAGNOSIS — Z1379 Encounter for other screening for genetic and chromosomal anomalies: Secondary | ICD-10-CM | POA: Insufficient documentation

## 2015-05-03 DIAGNOSIS — C50912 Malignant neoplasm of unspecified site of left female breast: Secondary | ICD-10-CM

## 2015-05-03 NOTE — Telephone Encounter (Signed)
Discussed with Ms. Schuff that her genetic test results were negative for any changes within any of 9 genes associated with increased risks for breast cancer.  We still do not have an explanation for why she had breast cancer twice or for the paternal family history of breast cancer, however, most cancer is not genetic.  Future cancer screening would be based upon the personal and family history of cancer.  Her granddaughters may begin cancer screening earlier, but screening guidelines will likely look very different by the time they would need to begin thinking about this.

## 2015-05-03 NOTE — Progress Notes (Signed)
GENETIC TEST RESULTS  HPI: [name] was previously seen in the [place] [facility] due to a personal history of cancer in both breasts, family history of breast cancer in a paternal aunt and paternal grandmother, and concerns regarding a hereditary predisposition to cancer. Please refer to our prior cancer genetics clinic note from April 20, 2015 for more information regarding [name]'s medical, social and family histories, and our assessment and recommendations, at the time. [name] recent genetic test results were disclosed to her, as were recommendations warranted by these results. These results and recommendations are discussed in more detail below.  GENETIC TEST RESULTS: At the time of [name]'s visit, we recommended she pursue genetic testing of the 9-gene Breast High/Moderate Risk Panel through [facility] [name], MD). The Breast High/Moderate Risk gene panel offered by [facility] includes sequencing and deletion/duplication analysis of the following 9 genes: ATM, BRCA1, BRCA2, CDH1, CHEK2, PALB2, PTEN, STK11, and TP53.  The results are now back, the report date of which is April 30, 2015.  Genetic testing was normal, and did not reveal a deleterious mutation in these genes.  Additionally, no variants of uncertain significance (VUSs) were found.  The test report will be scanned into EPIC and will be located under the Media tab.   We discussed with [name] that since the current genetic testing is not perfect, it is possible there may be a gene mutation in one of these genes that current testing cannot detect, but that chance is small. We also discussed, that it is possible that another gene that has not yet been discovered, or that we have not yet tested, is responsible for the cancer diagnoses in the family, and it is, therefore, important to remain in touch with cancer genetics in the future so that we can continue to offer [name] the most up to date genetic  testing.   CANCER SCREENING RECOMMENDATIONS: We still do not have an explanation for the personal and family history of breast cancer, however, this result is reassuring and indicates that [name] likely does not have an increased risk for a future cancer due to a mutation in one of these genes. This normal test also suggests that [name] breast cancer diagnoses were most likely not due to an inherited predisposition associated with one of these genes.  Most cancers happen by chance and this negative test suggests that her cancer falls into this category.  We, therefore, recommended she continue to follow the cancer management and screening guidelines provided by her oncology and primary healthcare provider.   RECOMMENDATIONS FOR FAMILY MEMBERS: Women in this family might be at some increased risk of developing cancer, over the general population risk, simply due to the family history of cancer. We recommended women in this family have a yearly mammogram beginning at age 47, or 18 years younger than the earliest onset of cancer, an an annual clinical breast exam, and perform monthly breast self-exams. Thus, [name]'s granddaughters would begin having annual mammograms at the age of 23 based on current guidelines.  However, these guidelines are likely to change by the time they near screening age.  Women in this family should also have a gynecological exam as recommended by their primary provider. All family members should have a colonoscopy by age 11.    FOLLOW-UP: Lastly, we discussed with [name] that cancer genetics is a rapidly advancing field and it is possible that new genetic tests will be appropriate for her  and/or her family members in the future. We encouraged her to remain in contact with cancer genetics on an annual basis so we can update her personal and family histories and let her know of advances in cancer genetics that may benefit this family.   Our contact number was provided.  [name] questions were answered to her satisfaction, and she knows she is welcome to call us at anytime with additional questions or concerns.   Jeanine Luz, MS Genetic Counselor [email or web address] Phone: [phone]

## 2015-05-13 DIAGNOSIS — M810 Age-related osteoporosis without current pathological fracture: Secondary | ICD-10-CM | POA: Diagnosis not present

## 2015-05-13 DIAGNOSIS — Z79899 Other long term (current) drug therapy: Secondary | ICD-10-CM | POA: Diagnosis not present

## 2015-05-18 ENCOUNTER — Other Ambulatory Visit: Payer: Self-pay | Admitting: Neurology

## 2015-05-26 ENCOUNTER — Other Ambulatory Visit: Payer: Self-pay | Admitting: Family Medicine

## 2015-05-27 DIAGNOSIS — K449 Diaphragmatic hernia without obstruction or gangrene: Secondary | ICD-10-CM | POA: Diagnosis not present

## 2015-05-27 DIAGNOSIS — K219 Gastro-esophageal reflux disease without esophagitis: Secondary | ICD-10-CM | POA: Diagnosis not present

## 2015-05-27 DIAGNOSIS — D509 Iron deficiency anemia, unspecified: Secondary | ICD-10-CM | POA: Diagnosis not present

## 2015-05-27 NOTE — Telephone Encounter (Signed)
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

## 2015-06-02 ENCOUNTER — Ambulatory Visit (INDEPENDENT_AMBULATORY_CARE_PROVIDER_SITE_OTHER): Payer: Medicare Other | Admitting: Family Medicine

## 2015-06-02 ENCOUNTER — Encounter: Payer: Self-pay | Admitting: Family Medicine

## 2015-06-02 VITALS — BP 138/70 | HR 72 | Ht 64.0 in | Wt 138.8 lb

## 2015-06-02 DIAGNOSIS — M19041 Primary osteoarthritis, right hand: Secondary | ICD-10-CM

## 2015-06-02 DIAGNOSIS — F321 Major depressive disorder, single episode, moderate: Secondary | ICD-10-CM | POA: Diagnosis not present

## 2015-06-02 DIAGNOSIS — G4700 Insomnia, unspecified: Secondary | ICD-10-CM | POA: Diagnosis not present

## 2015-06-02 DIAGNOSIS — R4189 Other symptoms and signs involving cognitive functions and awareness: Secondary | ICD-10-CM

## 2015-06-02 MED ORDER — DONEPEZIL HCL 10 MG PO TABS: ORAL_TABLET | ORAL | Status: DC

## 2015-06-02 MED ORDER — TRAZODONE HCL 50 MG PO TABS: ORAL_TABLET | ORAL | Status: DC

## 2015-06-02 NOTE — Progress Notes (Signed)
Chief Complaint  Patient presents with  . Depression    follow up. Trazodone making her sleepy during the day but helping with sleep.    She presents to follow up on her depression.  Overall her moods are better. Her pets keep her happy. She is not crying as much.  She finally set up a visit with a therapist (but realizes she will be in [place], and will need to reschedule).  She is back to playing tennis.  She is sleeping better (with a nightly medication, see below).  She still has problems related to her memory--not remembering to put things on her calendar, and has missed a tennis date. This causes her some concern/anxiety. She is back to working with a trainer twice a week.  Needs to take a nap after having breakfast and walking the dog. "brain feels clogged:"  After a 15-20 minute nap, she is fine for the rest of the day.  This interferes with her 8:30 tennis. In the winter, she plays in the afternoon. She takes 50mg  of trazadone at bedtime. She is falling asleep well, gets up once to go to the bathroom and is able to get back to sleep.  Her foot is better and she is back to playing tennis. She does still note  some pain on the top of the right foot when she walks in unsupportive shoes (sandals/flip flops), no pain when in tennis shoes.  Pain in her right 4th finger at DIP.  It is painful to play tennis, even when she takes aleve prior to playing. It swells, and is hard to move.  She continues to have some congestion and allergies, scratchy throat.  Symptoms are mild, so isn't taking anything for it.  Put on arimidex by Dr. Jana Hakim (prior to our last visit), and is tolerating it without side effects. She was started on Prolia, and there was some confusion when she picked it up from the pharmacy.  Her friend Constance Holster ended up giving her the SQ injection. She denies side effects. She isn't sure if she is still taking alendronate as well. She had negative genetic testing.  Memory loss--she no longer  wants to see the neurologist. She is asking for refill of the aricept today. Denies side effects.  She continues to have trouble with her memory--unable to tell me details about her medications, dates, names.  She jokes about her memory problems, but clearly is sometimes also very bothered by it.  Knee pain--only when she stands up from sitting, very short-lived. Lasts just a few seconds, then is fine.  No pain when playing tennis or standing (just initially). Some back pain periodiclally, not currently. More on left than right.  Area of discomfort is muscular  PMH, pSH, SH reviewed Outpatient Encounter Prescriptions as of 06/02/2015  Medication Sig Note  . alendronate (FOSAMAX) 70 MG tablet Take 70 mg by mouth every 7 (seven) days.  03/19/2015: .   . anastrozole (ARIMIDEX) 1 MG tablet Take 1 tablet (1 mg total) by mouth daily.   Marland Kitchen aspirin 81 MG tablet Take 81 mg by mouth daily.   . Biotin 5000 MCG TABS Take 1 tablet by mouth daily.   . calcium carbonate (OS-CAL) 600 MG TABS Take 600 mg by mouth daily.    . cholecalciferol (VITAMIN D) 1000 UNITS tablet Take 1,000 Units by mouth daily.   Marland Kitchen donepezil (ARICEPT) 10 MG tablet Take 1 tablet daily   . escitalopram (LEXAPRO) 10 MG tablet TAKE 1/2 TABLET BY  MOUTH EVERY MORNING. INCREASE TO 1 TABLET AFTER A WEEK IF TOLERATING   . ferrous sulfate 325 (65 FE) MG tablet Take 325 mg by mouth daily with breakfast.   . fish oil-omega-3 fatty acids 1000 MG capsule Take 2 g by mouth daily.    . Ginkgo Biloba 40 MG TABS Take 1 tablet by mouth daily.   Marland Kitchen glucosamine-chondroitin 500-400 MG tablet Take 2 tablets by mouth daily.    . Misc Natural Products (OSTEO BI-FLEX JOINT SHIELD PO) Take 2 tablets by mouth daily.   . Multiple Vitamins-Minerals (MULTIVITAMIN WITH MINERALS) tablet Take 1 tablet by mouth daily.   . naproxen sodium (ANAPROX) 220 MG tablet Take 220 mg by mouth as needed (pain).  03/19/2015: .   . omeprazole (PRILOSEC) 40 MG capsule Take 40 mg by mouth  daily.  03/19/2015: .   Marland Kitchen PROLIA 60 MG/ML SOLN injection INJ 1 ML Cascade Q 6 MONTH 06/02/2015: Received from: External Pharmacy  . traZODone (DESYREL) 50 MG tablet Start at 1/2 tablet by mouth at bedtime.  Increase by 1/2 tablet every few days until effective, up to 2 tablets nightly   . vitamin B-12 (CYANOCOBALAMIN) 100 MCG tablet Take 100 mcg by mouth daily.   . vitamin E 100 UNIT capsule Take 100 Units by mouth daily.   . [DISCONTINUED] donepezil (ARICEPT) 10 MG tablet Take 1 tablet daily   . [DISCONTINUED] traZODone (DESYREL) 50 MG tablet Start at 1/2 tablet by mouth at bedtime.  Increase by 1/2 tablet every few days until effective, up to 2 tablets nightly   . acetaminophen (TYLENOL) 325 MG tablet Take 325 mg by mouth as needed for mild pain or moderate pain.  03/19/2015: .   . oxyCODONE-acetaminophen (PERCOCET) 10-325 MG per tablet Take 1 tablet by mouth every 6 (six) hours as needed for pain. (Patient not taking: Reported on 04/29/2015)   . [DISCONTINUED] anastrozole (ARIMIDEX) 1 MG tablet Take 1 tablet (1 mg total) by mouth daily.   . [DISCONTINUED] donepezil (ARICEPT) 10 MG tablet Take 10 mg by mouth at bedtime.    No facility-administered encounter medications on file as of 06/02/2015.   No Known Allergies  ROS: no fever, chills.  +allergies/congestion that is mild.  No significant cough, shortness of breath, chest pain, nausea, vomiting, bowel changes, bleeding, bruising, rash, edema.  +foot pain, knee pain as per HPI.  See HPI.  PHYSICAL EXAM: BP 138/70 mmHg  Pulse 72  Ht 5\' 4"  (1.626 m)  Wt 138 lb 12.8 oz (62.959 kg)  BMI 23.81 kg/m2  Well developed, pleasant female in no distress.  She brings in a list of her concerns so as not to forget to discuss them HEENT: PERRL, EOMI, conjunctiva clear.  OP is clear. Neck: no lymphadenopathy, thyromegaly or mass Heart: regular rate and rhythm Lungs: clear bilaterally Abdomen: soft, nontender, no mass Back: no CVA tenderness, spinal or muscular  tenderness Extremities:  Right 4th DIP--+joint swelling. Doesn't completely straighten and some limitation in flexion. Pulses normal in feet.  Minimal soft tissue swelling on top of right foot, no bruising, warmth or focal tenderness Psych: normal mood, affect, hygiene, grooming, speech and eye contact Neuro: alert.  Normal gait, strength, cranial nerves MMSE --3/5 for orientation (missed date and day of week), and got 3/5 for serial substractions. Rest was all correct. See scanned form.  ASSESSMENT/PLAN:  Insomnia - improved with Trazadone.  some morning sleepiness--?if residual from Trazadone, vs related to Lexapro. trial 1/2 tab trazadone vs qHS lexapro -  Plan: traZODone (DESYREL) 50 MG tablet  Major depressive disorder, single episode, moderate - improved, less crying.  Counseling still encouraged, and is scheduled  Cognitive decline - stable.  continue Aricept daily - Plan: donepezil (ARICEPT) 10 MG tablet  OA (osteoarthritis) of finger, right - interfering with her tennis game.  refer to Dr. Amedeo Plenty for eval/treatment. - Plan: Ambulatory referral to Orthopedic Surgery  We are going to refer you to Dr. Amedeo Plenty for your hand/finger pain.  Try cutting the trazadone tablet in half to see if you aren't as sleepy the following day. If that isn't effective in either helping your sleep or in improving your sleepiness the next day, then we need to look into other medication options.    You can also try NOT taking anything, and see how you sleep. If you find that you sleep well most of the time, but sometimes have trouble, you can try using the trazadone just as needed (meaning--take it only if you haven't slept well for a few nights).  Don't take it if you wake up in the middle of the night and can't get back to sleep or you will be too groggy the next day.    There is also the possiblity that the lexapro (anti-depressant) might be making you sleepy (if the tiredness starts after taking your  morning medications).  You can always try switching to taking this pill in the evening, and see if that makes a difference.   You have listed both alendronate (fosamax) and prolia on your med list.  Usually one doesn't take BOTH types of medications to treat bones.  Please check with your oncologist (Dr. Virgie Dad office) to see if you are still supposed to be taking the alendronate or not.  Please use your pill box--set an alarm for Sunday morning to remind you to prepare the pill box for the week.

## 2015-06-02 NOTE — Patient Instructions (Signed)
  We are going to refer you to Dr. Amedeo Plenty for your hand/finger pain.  Try cutting the trazadone tablet in half to see if you aren't as sleepy the following day. If that isn't effective in either helping your sleep or in improving your sleepiness the next day, then we need to look into other medication options.    You can also try NOT taking anything, and see how you sleep. If you find that you sleep well most of the time, but sometimes have trouble, you can try using the trazadone just as needed (meaning--take it only if you haven't slept well for a few nights).  Don't take it if you wake up in the middle of the night and can't get back to sleep or you will be too groggy the next day.    There is also the possiblity that the lexapro (anti-depressant) might be making you sleepy (if the tiredness starts after taking your morning medications).  You can always try switching to taking this pill in the evening, and see if that makes a difference.   You have listed both alendronate (fosamax) and prolia on your med list.  Usually one doesn't take BOTH types of medications to treat bones.  Please check with your oncologist (Dr. Virgie Dad office) to see if you are still supposed to be taking the alendronate or not.  Please use your pill box--set an alarm for Sunday morning to remind you to prepare the pill box for the week.

## 2015-06-08 ENCOUNTER — Encounter: Payer: Self-pay | Admitting: *Deleted

## 2015-06-08 ENCOUNTER — Telehealth: Payer: Self-pay | Admitting: *Deleted

## 2015-06-08 NOTE — Telephone Encounter (Signed)
This RN reviewed pt's chart per her inquiry.  Per visit at this office on 04/09/2015 Prolia not on medication list. ( new breast cancer pt appointment ) No documentation that Prolia was ordered nor administered per this office.  Noted as entered on 06/02/2015 by [place] with date of 04/30/2015 next to entry per med list tab with " historical provider   Noted per Dr Johnsie Kindred visit on 04/29/2015 Prolia not on med list per her dictation.  This RN attempted to contact pt - obtained VM- message left to return call to this RN for further inquiry.  This RN attempted to contacted Dr Pascal Lux to inquire if [place] works in their office for clarification.  At present this note will be forwarded to Dr Tomi Bamberger. This RN will await return call from pt to discuss above.

## 2015-06-08 NOTE — Progress Notes (Signed)
Clarification - entry for Prolia was by Clinton Sawyer.

## 2015-06-08 NOTE — Telephone Encounter (Signed)
VM message received @ 10:57 am. Pt states she saw her PCP, med reviewed and her PCP saw that she was on Prolia and Alendronate(Fosamax). Her PCP told her she should not be on both and to contact her oncologist for guidance.  Please advise.

## 2015-06-08 NOTE — Telephone Encounter (Signed)
Thank you-- hope that clears up the issue

## 2015-06-08 NOTE — Telephone Encounter (Signed)
Chart reviewed.  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  Usually when it is entered like that ("historical") it was from the "reconcile dispensed med" section of chart, so SOMEONE prescribed it.  I'm thinking now that maybe it was Dr. Leo Grosser, her GYN, as I have it listed in her chart that she treats her osteoporosis.  The patient has memory issues, and really isn't clear on what she is taking, which is why I wanted to verify that she wasn't taking both medications.  I will have [place] contact patient tomorrow and have her check with Dr. Caralee Ates office, since it clearly didn't come from her oncologist.  Thanks

## 2015-06-09 ENCOUNTER — Ambulatory Visit (INDEPENDENT_AMBULATORY_CARE_PROVIDER_SITE_OTHER): Payer: 59 | Admitting: Licensed Clinical Social Worker

## 2015-06-09 DIAGNOSIS — F329 Major depressive disorder, single episode, unspecified: Secondary | ICD-10-CM | POA: Diagnosis not present

## 2015-06-09 NOTE — Telephone Encounter (Signed)
Spoke with patient and rx for Prolia was from Dr.Haygood, her gynecologist.

## 2015-06-09 NOTE — Telephone Encounter (Signed)
Patient called reporting she is "returning call to [facility]".  No messages to share with patient.  Asked if she has called Dr. Caralee Ates office and she believes "the doctors need to call each other and clarify medicines".  Takes ownership of "this may be her fault because of memory problems when she lost her spouse and I may have written down the wrong medicine.  I went through my medicines and I DO NOT TAKE PROLIA.  This can be removed from the medication list."  This nurse will add comment that she no longer takes to correct medication list.  Advised that Val is out of office. If there is anything else needed Val will return call.  If no return call, everything is clarified.

## 2015-06-09 NOTE — Telephone Encounter (Signed)
Patient advised and will contact Dr.Haygood's office.

## 2015-06-09 NOTE — Telephone Encounter (Signed)
Then she needs to check with Dr. Leo Grosser whether or not she should be still taking the alendronate or not

## 2015-06-09 NOTE — Telephone Encounter (Signed)
[place], thanks. I put in a call to Dr.Haygood's office to verify whether or not she is to take both meds. Waiting on call back.

## 2015-06-14 NOTE — Telephone Encounter (Signed)
Sharalyn Ink @ Dr.Haygood's office (GYN) called me back after confirming with the doctor that patient is to only be on Prolia injection and NOT to be taking Fosamax. Dr.Haygood also expressed her concern for patient's cognition status and recommended that patient follow up with PCP to assess this-I assured her nurse Sharalyn Ink) that Dr.[name] is aware of this and has been addressed at recent office visit's.

## 2015-06-18 ENCOUNTER — Ambulatory Visit: Payer: Medicare Other | Admitting: Licensed Clinical Social Worker

## 2015-06-30 ENCOUNTER — Ambulatory Visit (INDEPENDENT_AMBULATORY_CARE_PROVIDER_SITE_OTHER): Payer: 59 | Admitting: Licensed Clinical Social Worker

## 2015-06-30 DIAGNOSIS — F329 Major depressive disorder, single episode, unspecified: Secondary | ICD-10-CM | POA: Diagnosis not present

## 2015-07-06 DIAGNOSIS — M25561 Pain in right knee: Secondary | ICD-10-CM | POA: Diagnosis not present

## 2015-07-06 DIAGNOSIS — M1712 Unilateral primary osteoarthritis, left knee: Secondary | ICD-10-CM | POA: Diagnosis not present

## 2015-07-06 DIAGNOSIS — M1711 Unilateral primary osteoarthritis, right knee: Secondary | ICD-10-CM | POA: Diagnosis not present

## 2015-07-06 DIAGNOSIS — M170 Bilateral primary osteoarthritis of knee: Secondary | ICD-10-CM | POA: Diagnosis not present

## 2015-07-06 DIAGNOSIS — M79644 Pain in right finger(s): Secondary | ICD-10-CM | POA: Diagnosis not present

## 2015-07-07 ENCOUNTER — Telehealth: Payer: Self-pay | Admitting: Oncology

## 2015-07-07 ENCOUNTER — Other Ambulatory Visit: Payer: Self-pay | Admitting: *Deleted

## 2015-07-07 NOTE — Telephone Encounter (Signed)
Returned Advertising account executive. Patient moved appointment from 09/19 to 10/03. Confirmed appointment

## 2015-07-07 NOTE — Telephone Encounter (Signed)
"  I have an appointment with Dr. Jana Hakim at 0900 I am going to have to change.  Can someone please call me to arrange another time for my appointment.  Return number [phone]."

## 2015-07-12 ENCOUNTER — Encounter: Payer: Medicare Other | Admitting: Oncology

## 2015-07-15 DIAGNOSIS — M1712 Unilateral primary osteoarthritis, left knee: Secondary | ICD-10-CM | POA: Diagnosis not present

## 2015-07-15 DIAGNOSIS — M2242 Chondromalacia patellae, left knee: Secondary | ICD-10-CM | POA: Diagnosis not present

## 2015-07-15 DIAGNOSIS — M25562 Pain in left knee: Secondary | ICD-10-CM | POA: Diagnosis not present

## 2015-07-16 DIAGNOSIS — M19041 Primary osteoarthritis, right hand: Secondary | ICD-10-CM | POA: Diagnosis not present

## 2015-07-22 DIAGNOSIS — D1801 Hemangioma of skin and subcutaneous tissue: Secondary | ICD-10-CM | POA: Diagnosis not present

## 2015-07-22 DIAGNOSIS — L821 Other seborrheic keratosis: Secondary | ICD-10-CM | POA: Diagnosis not present

## 2015-07-22 DIAGNOSIS — L814 Other melanin hyperpigmentation: Secondary | ICD-10-CM | POA: Diagnosis not present

## 2015-07-26 ENCOUNTER — Telehealth: Payer: Self-pay | Admitting: Oncology

## 2015-07-26 ENCOUNTER — Telehealth: Payer: Self-pay | Admitting: Nurse Practitioner

## 2015-07-26 ENCOUNTER — Other Ambulatory Visit: Payer: Self-pay | Admitting: *Deleted

## 2015-07-26 ENCOUNTER — Ambulatory Visit: Payer: Medicare Other | Admitting: Oncology

## 2015-07-26 DIAGNOSIS — C50911 Malignant neoplasm of unspecified site of right female breast: Secondary | ICD-10-CM

## 2015-07-26 NOTE — Telephone Encounter (Signed)
S.w. Pt and advised on NOV appt....the patient ok and aware °

## 2015-07-26 NOTE — Telephone Encounter (Signed)
Returned patients call as she is ill today and has been rescheduled to heather due to wait on dr Jana Hakim appointment

## 2015-08-04 ENCOUNTER — Ambulatory Visit (HOSPITAL_BASED_OUTPATIENT_CLINIC_OR_DEPARTMENT_OTHER): Payer: Medicare Other | Admitting: Nurse Practitioner

## 2015-08-04 ENCOUNTER — Encounter: Payer: Self-pay | Admitting: Nurse Practitioner

## 2015-08-04 ENCOUNTER — Telehealth: Payer: Self-pay | Admitting: Nurse Practitioner

## 2015-08-04 VITALS — BP 164/73 | HR 68 | Temp 98.0°F | Resp 18 | Ht 63.5 in | Wt 138.6 lb

## 2015-08-04 DIAGNOSIS — C50311 Malignant neoplasm of lower-inner quadrant of right female breast: Secondary | ICD-10-CM

## 2015-08-04 DIAGNOSIS — Z170 Estrogen receptor positive status [ER+]: Secondary | ICD-10-CM | POA: Diagnosis not present

## 2015-08-04 DIAGNOSIS — Z79811 Long term (current) use of aromatase inhibitors: Secondary | ICD-10-CM

## 2015-08-04 DIAGNOSIS — C50911 Malignant neoplasm of unspecified site of right female breast: Secondary | ICD-10-CM

## 2015-08-04 NOTE — Progress Notes (Signed)
[place]  [phone] [phone]     ID: MIRAI GREENWOOD DOB: [birth year]  MR#: [id number]  LXB#:[id number]  Patient Care Team: Rita Ohara, MD as PCP - General (Family Medicine) PCP: Vikki Ports, MD GYN: SU: Rolm Bookbinder MD OTHER MD: Thea Silversmith MD  CHIEF COMPLAINT:   CURRENT TREATMENT:    BREAST CANCER HISTORY: Evelyn White ("pronounced DOO-rren in [place], [place] in [place], either way in the [place]") has a remote history of left breast cancer (1996), for which she received surgery, radiation, and 5 years of tamoxifen.  More recently she had routine screening mammography at [facility] 02/18/2015, with tomosynthesis. The breast density was category B. There was a focal asymmetry in the right breast at the 5:00 position. Right diagnostic mammography with tomosynthesis 02/24/2015 confirmed a new area of focal asymmetric density in the lower inner quadrant of the right breast, which was not identified by ultrasound the same day. Core biopsy was obtained and showed atypical lobular hyperplasia. This was felt to be possibly discordant, and the patient proceeded to excisional biopsy 03/25/2015 showing ([id number]) an invasive lobular carcinoma, E-cadherin negative, measuring 4 mm. Margins were ample. The tumor was 90% estrogen receptor positive, with strong staining intensity, but progesterone receptor negative. The MIB-1 was 3%. HER-2 was not amplified, with a signals ratio of 1.72, and the number per cell 2.45.   Her case was presented at the multidisciplinary breast cancer conference 04/07/2015. At that time the consensus was no sentinel lymph node was necessary. Also that the patient would benefit from radiation. Anti-estrogens were to be considered Genetics referral was also recommended.  INTERVAL HISTORY: Evelyn White returns for follow of of her right breast cancer. Since her last visit here she was started on anastrozole. This was originally thought to have  caused some right finger pain and stiffness. She is now being referred to Dr. Amedeo Plenty for evaluation of osteoarthritis. She denies hot flashes or vaginal changes. The interval history is remarkable for meeting with Dr. Pablo Ledger, and ultimately deciding to forego radiation to her right lumpectomy site. Evelyn White is very active. She has some knee pain but still works out and plays tennis regularly. She has no new complaints to offer today. She has a history of short term memory loss and depression, and works wit Dr. Tomi Bamberger for prescriptions related to these conditions.   REVIEW OF SYSTEMS: A detailed review of systems is entirely negative, except where noted above.  PAST MEDICAL HISTORY: Past Medical History  Diagnosis Date  . OA (osteoarthritis)     hands,feet  . Varicose veins   . Left knee pain     h/o-Dr.Collins  . BCC (basal cell carcinoma)     RLE-Dr.Lomax  . Colon polyps 5/06    hyperplastic  . Anemia 05/2009    iron deficient  . Gastric polyps 06/2009    small(Dr.Mann) EGD and Colonoscopy   . Osteoporosis     managed by Dr. Leo Grosser  . GERD (gastroesophageal reflux disease)   . Breast cancer 1995    h/o left breast CA-s/p lumpectomy,radiation and Tamoxifen x 5years  . Breast cancer 2016    [place] of right breast    PAST SURGICAL HISTORY: Past Surgical History  Procedure Laterality Date  . Upper gastrointestinal endoscopy  08/18/13    large hiatal hernia  . Tonsillectomy  age 66  . Breast lumpectomy  1995    left breast  . Varicose vein treatment  1990's    LLE  . Cataract extraction,  bilateral  12/2013    Dr. Luretha Rued  . Radioactive seed guided excisional breast biopsy Right 03/25/2015    Procedure: RADIOACTIVE SEED GUIDED RIGHT EXCISIONAL BREAST BIOPSY;  Surgeon: Rolm Bookbinder, MD;  Location: [facility];  Service: General;  Laterality: Right;    FAMILY HISTORY Family History  Problem Relation Age of Onset  . Hypertension Mother   . Osteoporosis Mother   . Alcohol abuse Son    . Heart disease Maternal Uncle   . Cancer Paternal Grandmother 14    breast cancer  . Diabetes Neg Hx   . Breast cancer Paternal Aunt     dx. age unknown; lim info  The patient's father died in his late 76s "from old age". The patient's father's mother had a history of breast cancer late in life. The patient's own mother died at the age of 79. That is an only child. There is no history of ovarian cancer in the family to her knowledge.  GYNECOLOGIC HISTORY:  No LMP recorded. Patient is postmenopausal. Menarche age 81, first live birth age 33. She is GX P3. She does not recall when she went through the change of life but did take hormone replacement for some years.   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 She is widowed, and lives by herself with 2 dogs and 2 cats. Son Earnestine Mealing teaches photography in [place]. Son Christia Reading worked for a Hydrographic surveyor business in [place]. Son Frederico Hamman is a Games developer in [place]. The patient has 2 grandchildren. She is not a church attender    ADVANCED DIRECTIVES: The patient believes she has completed advanced directives and that she has named her son Earnestine Mealing as healthcare power of attorney. His wife, Evelyn White can be reached at [phone]   HEALTH MAINTENANCE: Social History  Substance Use Topics  . Smoking status: Former Smoker -- 0.50 packs/day for 5 years    Types: Cigarettes    Quit date: 10/23/1981  . Smokeless tobacco: Never Used  . Alcohol Use: 0.0 oz/week    0 Standard drinks or equivalent per week     Comment: 1.5 glasses of red wine daily     Colonoscopy:  PAP:  Bone density:  Lipid panel:  No Known Allergies  Current Outpatient Prescriptions  Medication Sig Dispense Refill  . alendronate (FOSAMAX) 70 MG tablet Take 70 mg by mouth every 7 (seven) days.     Marland Kitchen anastrozole (ARIMIDEX) 1 MG tablet Take 1 tablet (1 mg total) by mouth daily. 90 tablet 4  . aspirin 81 MG tablet Take 81 mg by mouth daily.    .  calcium carbonate (OS-CAL) 600 MG TABS Take 600 mg by mouth daily.     . cholecalciferol (VITAMIN D) 1000 UNITS tablet Take 1,000 Units by mouth daily.    Marland Kitchen donepezil (ARICEPT) 10 MG tablet Take 1 tablet daily 30 tablet 11  . escitalopram (LEXAPRO) 10 MG tablet TAKE 1/2 TABLET BY MOUTH EVERY MORNING. INCREASE TO 1 TABLET AFTER A WEEK IF TOLERATING (Patient taking differently: No sig reported) 30 tablet 5  . ferrous sulfate 325 (65 FE) MG tablet Take 325 mg by mouth daily with breakfast.    . fish oil-omega-3 fatty acids 1000 MG capsule Take 2 g by mouth daily.     Marland Kitchen glucosamine-chondroitin 500-400 MG tablet Take 2 tablets by mouth daily.     . Misc Natural Products (OSTEO BI-FLEX JOINT SHIELD PO) Take 2 tablets by mouth daily.    Marland Kitchen  Multiple Vitamins-Minerals (MULTIVITAMIN WITH MINERALS) tablet Take 1 tablet by mouth daily.    . vitamin B-12 (CYANOCOBALAMIN) 100 MCG tablet Take 100 mcg by mouth daily.    Marland Kitchen acetaminophen (TYLENOL) 325 MG tablet Take 325 mg by mouth as needed for mild pain or moderate pain.     . Biotin 5000 MCG TABS Take 1 tablet by mouth daily.    . Ginkgo Biloba 40 MG TABS Take 1 tablet by mouth daily.    . naproxen sodium (ANAPROX) 220 MG tablet Take 220 mg by mouth as needed (pain).     Marland Kitchen omeprazole (PRILOSEC) 40 MG capsule Take 40 mg by mouth daily.     . vitamin E 100 UNIT capsule Take 100 Units by mouth daily.     No current facility-administered medications for this visit.    OBJECTIVE: Older white woman who appears well Filed Vitals:   08/04/15 1558  BP: 164/73  Pulse: 68  Temp: 98 F (36.7 C)  Resp: 18     Body mass index is 24.16 kg/(m^2).    ECOG FS:0 - Asymptomatic  Skin: warm, dry  HEENT: sclerae anicteric, conjunctivae pink, oropharynx clear. No thrush or mucositis.  Lymph Nodes: No cervical or supraclavicular lymphadenopathy  Lungs: clear to auscultation bilaterally, no rales, wheezes, or rhonci  Heart: regular rate and rhythm  Abdomen: round, soft,  non tender, positive bowel sounds  Musculoskeletal: No focal spinal tenderness, no peripheral edema  Neuro: non focal, well oriented, positive affect  Breasts: right breast status post lumpectomy. No evidence of recurrent disease. Right axilla benign. Left breast status post remote lumpectomy and radiation. No evidence of recurrent disease. Left axilla benign.   LAB RESULTS:  CMP     Component Value Date/Time   NA 140 04/09/2015 1345   NA 138 03/25/2015 0943   K 4.0 04/09/2015 1345   K 4.2 03/25/2015 0943   CL 104 03/25/2015 0943   CO2 29 04/09/2015 1345   CO2 24 03/25/2015 0943   GLUCOSE 141* 04/09/2015 1345   GLUCOSE 106* 03/25/2015 0943   BUN 14.9 04/09/2015 1345   BUN 15 03/25/2015 0943   CREATININE 0.8 04/09/2015 1345   CREATININE 0.75 03/25/2015 0943   CREATININE 0.71 04/23/2014 1126   CALCIUM 9.4 04/09/2015 1345   CALCIUM 9.0 03/25/2015 0943   PROT 7.0 04/09/2015 1345   PROT 6.8 04/23/2014 1126   ALBUMIN 4.3 04/09/2015 1345   ALBUMIN 4.6 04/23/2014 1126   AST 20 04/09/2015 1345   AST 22 04/23/2014 1126   ALT 17 04/09/2015 1345   ALT 16 04/23/2014 1126   ALKPHOS 68 04/09/2015 1345   ALKPHOS 52 04/23/2014 1126   BILITOT 0.74 04/09/2015 1345   BILITOT 1.1 04/23/2014 1126   GFRNONAA >60 03/25/2015 0943   GFRAA >60 03/25/2015 0943    INo results found for: SPEP, UPEP  Lab Results  Component Value Date   WBC 7.0 04/09/2015   NEUTROABS 4.9 04/09/2015   HGB 14.1 04/09/2015   HCT 43.0 04/09/2015   MCV 92.5 04/09/2015   PLT 262 04/09/2015      Chemistry      Component Value Date/Time   NA 140 04/09/2015 1345   NA 138 03/25/2015 0943   K 4.0 04/09/2015 1345   K 4.2 03/25/2015 0943   CL 104 03/25/2015 0943   CO2 29 04/09/2015 1345   CO2 24 03/25/2015 0943   BUN 14.9 04/09/2015 1345   BUN 15 03/25/2015 0943   CREATININE 0.8 04/09/2015  1345   CREATININE 0.75 03/25/2015 0943   CREATININE 0.71 04/23/2014 1126      Component Value Date/Time   CALCIUM 9.4  04/09/2015 1345   CALCIUM 9.0 03/25/2015 0943   ALKPHOS 68 04/09/2015 1345   ALKPHOS 52 04/23/2014 1126   AST 20 04/09/2015 1345   AST 22 04/23/2014 1126   ALT 17 04/09/2015 1345   ALT 16 04/23/2014 1126   BILITOT 0.74 04/09/2015 1345   BILITOT 1.1 04/23/2014 1126       No results found for: LABCA2  No components found for: LABCA125  No results for input(s): INR in the last 168 hours.  Urinalysis    Component Value Date/Time   BILIRUBINUR neg 04/23/2014 0958   PROTEINUR neg 04/23/2014 0958   UROBILINOGEN negative 04/23/2014 0958   NITRITE neg 04/23/2014 0958   LEUKOCYTESUR Trace 04/23/2014 0958    STUDIES: No results found.  ASSESSMENT: 74 y.o. [place] woman status post right lumpectomy 03/25/2015 for a pT1a NX invasive lobular carcinoma, estrogen receptor 90% positive, progesterone receptor negative, with an MIB-1 of 3%, and no HER-2 amplification.  (1) genetics testing: The Breast High/Moderate Risk gene panel offered by [facility] includes sequencing and deletion/duplication analysis of the following 9 genes: ATM, BRCA1, BRCA2, CDH1, CHEK2, PALB2, PTEN, STK11, and TP53. Genetic testing was normal, and did not reveal a deleterious mutation in these genes. Additionally, no variants of uncertain significance (VUSs) were found.  (2) patient refused radiation  (3) anastrozole started June 2016  PLAN: Evelyn White looks and feels well today. She is tolerating the anastrozole well so far, and will continue this for at least 5 years of antiestrogen therapy.   She works closely with Dr. Tomi Bamberger she raves is very thorough and attentive. She has labs performed at this office, so we will not have any drawn here. She will continue to work with Dr. Tomi Bamberger regarding her memory loss and depression.  Evelyn White will return in 6 months for labs and a follow up visit. She understands and agrees with this plan. She knows the goal of treatment in her case is cure. She has been encouraged to call with any  issues that might arise before her next visit here.    Laurie Panda, NP   08/04/2015 4:28 PM

## 2015-08-04 NOTE — Telephone Encounter (Signed)
Appointments made and avs printed for patient °

## 2015-08-12 ENCOUNTER — Telehealth: Payer: Self-pay | Admitting: Oncology

## 2015-08-12 NOTE — Telephone Encounter (Signed)
no vm....mailed pt appt sched and letter for [facility]

## 2015-08-23 DIAGNOSIS — M1712 Unilateral primary osteoarthritis, left knee: Secondary | ICD-10-CM | POA: Diagnosis not present

## 2015-08-23 DIAGNOSIS — M25562 Pain in left knee: Secondary | ICD-10-CM | POA: Diagnosis not present

## 2015-08-23 DIAGNOSIS — M2242 Chondromalacia patellae, left knee: Secondary | ICD-10-CM | POA: Diagnosis not present

## 2015-09-07 ENCOUNTER — Encounter: Payer: Self-pay | Admitting: Nurse Practitioner

## 2015-09-17 ENCOUNTER — Encounter: Payer: Self-pay | Admitting: Nurse Practitioner

## 2015-09-20 ENCOUNTER — Encounter: Payer: Self-pay | Admitting: Nurse Practitioner

## 2015-09-20 DIAGNOSIS — C50911 Malignant neoplasm of unspecified site of right female breast: Secondary | ICD-10-CM

## 2015-09-20 NOTE — Progress Notes (Signed)
The Survivorship Care Plan was mailed to Evelyn White as she was unable to come in to the Survivorship Clinic for an in-person visit at this time. A letter was mailed to her outlining the purpose of the content of the care plan, as well as encouraging her to reach out to me with any questions or concerns.  My business card was included in the correspondence to the patient as well.  A copy of the care plan was also routed/faxed/mailed to [name],[name], MD, the patient's PCP.  I will not be placing any follow-up appointments to the Survivorship Clinic for Evelyn White, but I am happy to see her at any time in the future for any survivorship concerns that may arise. Thank you for allowing me to participate in her care!  Kenn File, [place] [phone]

## 2015-09-28 DIAGNOSIS — M1711 Unilateral primary osteoarthritis, right knee: Secondary | ICD-10-CM | POA: Diagnosis not present

## 2015-09-28 DIAGNOSIS — M1712 Unilateral primary osteoarthritis, left knee: Secondary | ICD-10-CM | POA: Diagnosis not present

## 2015-09-28 DIAGNOSIS — M170 Bilateral primary osteoarthritis of knee: Secondary | ICD-10-CM | POA: Diagnosis not present

## 2015-10-26 ENCOUNTER — Telehealth: Payer: Self-pay | Admitting: Family Medicine

## 2015-10-26 NOTE — Telephone Encounter (Signed)
Evelyn White, called and wants you to give him a call regarding his moms care, and future care, states that his mom has dementia, spencer is on the pts hippa. Evelyn Hamman can be reached at [phone]. Told him you where out of the office today and that it may be tomorrow before he gets a call

## 2015-10-27 NOTE — Telephone Encounter (Signed)
LMOVM.  Will try again tomorrow.

## 2015-10-28 NOTE — Telephone Encounter (Signed)
Continuation of documentation of phone call with Evelyn White from 4:21. He has some concerns regarding Evelyn White had gone to the bathroom, and got lost in the airport while traveling to see other son in [place]. Took 45 mins to locate Evelyn.  He is asking about aides [facility]) vs possible other living options. We discussed using home health to set up pill boxes, and other things, but I doubt she would want an aide with Evelyn for extended times. We discussed some living situations (independent, with option for assisted, such as [facility]), and to explore these, and consider showing Evelyn ones that seem like nice options.  She clearly doesn't need to be maintaining such a large home just for herself and Evelyn pets.  (Evelyn friend Evelyn White usually checks on Evelyn daily, but Evelyn husband got a new job and will be gone for extended periods of time out of the country).  Son was told of Evelyn next appt date, and he would like to be here for that appt.  It is for a CPE, and we will also f/u on MMSE and dementia.

## 2015-10-28 NOTE — Telephone Encounter (Signed)
She got lost in the [place] airport

## 2015-11-15 ENCOUNTER — Ambulatory Visit (INDEPENDENT_AMBULATORY_CARE_PROVIDER_SITE_OTHER): Payer: Medicare Other | Admitting: Family Medicine

## 2015-11-15 ENCOUNTER — Encounter: Payer: Self-pay | Admitting: Family Medicine

## 2015-11-15 VITALS — BP 150/80 | HR 88 | Temp 98.5°F | Ht 64.0 in | Wt 131.6 lb

## 2015-11-15 DIAGNOSIS — J069 Acute upper respiratory infection, unspecified: Secondary | ICD-10-CM

## 2015-11-15 NOTE — Patient Instructions (Signed)
  Drink plenty of fluid. I recommend using guaifenesin (expectorant to keep mucus thin)--this is found in Robitussin, but I recommend using a 12 hour Mucinex so that you only have to take it twice daily. If you also need a cough suppressant, then get the DM version (of robitussin or Mucinex)--dextromethorphan is a cough suppressant.  If you didn't want to use a combination product, you could get the plain Mucinex (not DM), and get Delsym syrup, which is a 12 hour cough syrup of the DM medication.  Call next week if you still have discolored mucus and aren't better. Return for re-evaluation if you develop pain with breathing or shortness of breath.

## 2015-11-15 NOTE — Progress Notes (Signed)
Chief Complaint  Patient presents with  . Cough    started with ST a couple of day a ago. Has chest congestion, nasal congestion and just doesn't feel well. Mucus is green is color ,no fever known.    2 days ago she started with postnasal drainage, cough, runny nose. Denies sinus pain or ear aches, no sore throat.  It is now also in her chest.  Denies any shortness of breath.  Nasal mucus is yellow-green. No headaches, dizziness.  She has been taking a robitussin cough syrup, which helps some.  PMH, PSH, SH reviewed.  Outpatient Encounter Prescriptions as of 11/15/2015  Medication Sig Note  . alendronate (FOSAMAX) 70 MG tablet Take 70 mg by mouth every 7 (seven) days.  03/19/2015: .   . aspirin 81 MG tablet Take 81 mg by mouth daily.   . Biotin 5000 MCG TABS Take 1 tablet by mouth daily.   . calcium carbonate (OS-CAL) 600 MG TABS Take 600 mg by mouth daily.    . cholecalciferol (VITAMIN D) 1000 UNITS tablet Take 1,000 Units by mouth daily.   Marland Kitchen donepezil (ARICEPT) 10 MG tablet Take 1 tablet daily   . ferrous sulfate 325 (65 FE) MG tablet Take 325 mg by mouth daily with breakfast.   . fish oil-omega-3 fatty acids 1000 MG capsule Take 2 g by mouth daily.    Marland Kitchen glucosamine-chondroitin 500-400 MG tablet Take 2 tablets by mouth daily.    . Misc Natural Products (OSTEO BI-FLEX JOINT SHIELD PO) Take 2 tablets by mouth daily.   . Multiple Vitamins-Minerals (MULTIVITAMIN WITH MINERALS) tablet Take 1 tablet by mouth daily.   Marland Kitchen acetaminophen (TYLENOL) 325 MG tablet Take 325 mg by mouth as needed for mild pain or moderate pain. Reported on 11/15/2015 03/19/2015: .   . anastrozole (ARIMIDEX) 1 MG tablet Take 1 tablet (1 mg total) by mouth daily. (Patient not taking: Reported on 11/15/2015)   . escitalopram (LEXAPRO) 10 MG tablet TAKE 1/2 TABLET BY MOUTH EVERY MORNING. INCREASE TO 1 TABLET AFTER A WEEK IF TOLERATING (Patient not taking: Reported on 11/15/2015)   . Ginkgo Biloba 40 MG TABS Take 1 tablet by mouth  daily. Reported on 11/15/2015   . naproxen sodium (ANAPROX) 220 MG tablet Take 220 mg by mouth as needed (pain). Reported on 11/15/2015 03/19/2015: .   . omeprazole (PRILOSEC) 40 MG capsule Take 40 mg by mouth daily. Reported on 11/15/2015 03/19/2015: .   . vitamin B-12 (CYANOCOBALAMIN) 100 MCG tablet Take 100 mcg by mouth daily. Reported on 11/15/2015   . vitamin E 100 UNIT capsule Take 100 Units by mouth daily. Reported on 11/15/2015    No facility-administered encounter medications on file as of 11/15/2015.   No Known Allergies  ROS: no fever, chills, headaches, dizziness, chest pain, shortness of breath, nausea, vomiting, diarrhea, bleeding, bruising, rash, edema, depression, myalgias or other concerns.   PHYSICAL EXAM:  BP 150/80 mmHg  Pulse 88  Temp(Src) 98.5 F (36.9 C) (Tympanic)  Ht 5\' 4"  (1.626 m)  Wt 131 lb 9.6 oz (59.693 kg)  BMI 22.58 kg/m2 Well developed, well appearing female, in good spirits today. Not ill appearing HEENT: PERRL, EOM, conjunctiva and sclera are clear. TM's and EAC's normal.  Nasal mucosa only mildly edematous, no purulence. OP is clear. Sinuses nontender.  Neck: no lymphadenopathy, thyromegaly or mass Heart: regular rate and rhythm without murmur Lungs: clear bilaterally Neuro: alert, cranial nerves intact. Normal strength, gait Psych: normal mood, affect, hygiene and grooming  ASSESSMENT/PLAN:  Acute upper respiratory infection  Supportive measures and s/sx bacterial infection reviewed.    Drink plenty of fluid. I recommend using guaifenesin (expectorant to keep mucus thin)--this is found in Robitussin, but I recommend using a 12 hour Mucinex so that you only have to take it twice daily. If you also need a cough suppressant, then get the DM version (of robitussin or Mucinex)--dextromethorphan is a cough suppressant.  If you didn't want to use a combination product, you could get the plain Mucinex (not DM), and get Delsym syrup, which is a 12 hour cough  syrup of the DM medication.  Call next week if you still have discolored mucus and aren't better. Return for re-evaluation if you develop pain with breathing or shortness of breath.

## 2015-11-21 ENCOUNTER — Observation Stay (HOSPITAL_COMMUNITY)
Admission: EM | Admit: 2015-11-21 | Discharge: 2015-11-22 | Disposition: A | Discharge status: Home / Self Care | Payer: Medicare Other | Attending: Internal Medicine | Admitting: Internal Medicine

## 2015-11-21 ENCOUNTER — Emergency Department (HOSPITAL_COMMUNITY): Payer: Medicare Other

## 2015-11-21 ENCOUNTER — Encounter (HOSPITAL_COMMUNITY): Payer: Self-pay | Admitting: Emergency Medicine

## 2015-11-21 DIAGNOSIS — K219 Gastro-esophageal reflux disease without esophagitis: Secondary | ICD-10-CM | POA: Insufficient documentation

## 2015-11-21 DIAGNOSIS — R05 Cough: Secondary | ICD-10-CM | POA: Diagnosis not present

## 2015-11-21 DIAGNOSIS — R42 Dizziness and giddiness: Principal | ICD-10-CM

## 2015-11-21 DIAGNOSIS — R4189 Other symptoms and signs involving cognitive functions and awareness: Secondary | ICD-10-CM | POA: Diagnosis not present

## 2015-11-21 DIAGNOSIS — Z9114 Patient's other noncompliance with medication regimen: Secondary | ICD-10-CM | POA: Insufficient documentation

## 2015-11-21 DIAGNOSIS — Z853 Personal history of malignant neoplasm of breast: Secondary | ICD-10-CM | POA: Insufficient documentation

## 2015-11-21 DIAGNOSIS — W19XXXA Unspecified fall, initial encounter: Secondary | ICD-10-CM | POA: Diagnosis not present

## 2015-11-21 DIAGNOSIS — Z7982 Long term (current) use of aspirin: Secondary | ICD-10-CM | POA: Insufficient documentation

## 2015-11-21 DIAGNOSIS — J320 Chronic maxillary sinusitis: Secondary | ICD-10-CM | POA: Diagnosis not present

## 2015-11-21 DIAGNOSIS — F0390 Unspecified dementia without behavioral disturbance: Secondary | ICD-10-CM | POA: Diagnosis not present

## 2015-11-21 DIAGNOSIS — R413 Other amnesia: Secondary | ICD-10-CM | POA: Diagnosis not present

## 2015-11-21 DIAGNOSIS — K529 Noninfective gastroenteritis and colitis, unspecified: Secondary | ICD-10-CM | POA: Diagnosis not present

## 2015-11-21 DIAGNOSIS — F321 Major depressive disorder, single episode, moderate: Secondary | ICD-10-CM | POA: Diagnosis not present

## 2015-11-21 DIAGNOSIS — R197 Diarrhea, unspecified: Secondary | ICD-10-CM | POA: Diagnosis not present

## 2015-11-21 DIAGNOSIS — R55 Syncope and collapse: Secondary | ICD-10-CM | POA: Diagnosis not present

## 2015-11-21 DIAGNOSIS — F329 Major depressive disorder, single episode, unspecified: Secondary | ICD-10-CM | POA: Diagnosis not present

## 2015-11-21 DIAGNOSIS — R110 Nausea: Secondary | ICD-10-CM

## 2015-11-21 DIAGNOSIS — Z87891 Personal history of nicotine dependence: Secondary | ICD-10-CM | POA: Insufficient documentation

## 2015-11-21 DIAGNOSIS — Z85828 Personal history of other malignant neoplasm of skin: Secondary | ICD-10-CM | POA: Diagnosis not present

## 2015-11-21 DIAGNOSIS — E7220 Disorder of urea cycle metabolism, unspecified: Secondary | ICD-10-CM

## 2015-11-21 DIAGNOSIS — K2970 Gastritis, unspecified, without bleeding: Secondary | ICD-10-CM | POA: Diagnosis not present

## 2015-11-21 DIAGNOSIS — R112 Nausea with vomiting, unspecified: Secondary | ICD-10-CM | POA: Diagnosis not present

## 2015-11-21 DIAGNOSIS — D72829 Elevated white blood cell count, unspecified: Secondary | ICD-10-CM | POA: Diagnosis not present

## 2015-11-21 LAB — HEPATIC FUNCTION PANEL
ALT: 16 U/L (ref 14–54)
AST: 21 U/L (ref 15–41)
Albumin: 3.2 g/dL — ABNORMAL LOW (ref 3.5–5.0)
Alkaline Phosphatase: 84 U/L (ref 38–126)
TOTAL PROTEIN: 6.1 g/dL — AB (ref 6.5–8.1)
Total Bilirubin: 0.6 mg/dL (ref 0.3–1.2)

## 2015-11-21 LAB — CBC
HEMATOCRIT: 39.3 % (ref 36.0–46.0)
HEMOGLOBIN: 12.4 g/dL (ref 12.0–15.0)
MCH: 28.8 pg (ref 26.0–34.0)
MCHC: 31.6 g/dL (ref 30.0–36.0)
MCV: 91.4 fL (ref 78.0–100.0)
Platelets: 383 10*3/uL (ref 150–400)
RBC: 4.3 MIL/uL (ref 3.87–5.11)
RDW: 12.8 % (ref 11.5–15.5)
WBC: 13.8 10*3/uL — AB (ref 4.0–10.5)

## 2015-11-21 LAB — URINALYSIS, ROUTINE W REFLEX MICROSCOPIC
BILIRUBIN URINE: NEGATIVE
Glucose, UA: NEGATIVE mg/dL
Hgb urine dipstick: NEGATIVE
LEUKOCYTES UA: NEGATIVE
NITRITE: NEGATIVE
PH: 5.5 (ref 5.0–8.0)
Protein, ur: NEGATIVE mg/dL
SPECIFIC GRAVITY, URINE: 1.015 (ref 1.005–1.030)

## 2015-11-21 LAB — BASIC METABOLIC PANEL
ANION GAP: 13 (ref 5–15)
BUN: 6 mg/dL (ref 6–20)
CALCIUM: 8.8 mg/dL — AB (ref 8.9–10.3)
CO2: 20 mmol/L — ABNORMAL LOW (ref 22–32)
Chloride: 106 mmol/L (ref 101–111)
Creatinine, Ser: 0.68 mg/dL (ref 0.44–1.00)
GFR calc Af Amer: >60 mL/min (ref >60)
Glucose, Bld: 150 mg/dL — ABNORMAL HIGH (ref 65–99)
POTASSIUM: 3.2 mmol/L — AB (ref 3.5–5.1)
Sodium: 139 mmol/L (ref 135–145)

## 2015-11-21 LAB — LIPASE, BLOOD: LIPASE: 32 U/L (ref 11–51)

## 2015-11-21 LAB — TSH: TSH: 0.395 u[IU]/mL (ref 0.350–4.500)

## 2015-11-21 LAB — VITAMIN B12: VITAMIN B 12: 2599 pg/mL — AB (ref 180–914)

## 2015-11-21 LAB — CBG MONITORING, ED: GLUCOSE-CAPILLARY: 121 mg/dL — AB (ref 65–99)

## 2015-11-21 MED ORDER — ENOXAPARIN SODIUM 40 MG/0.4ML ~~LOC~~ SOLN
40.0000 mg | SUBCUTANEOUS | Status: DC
  Administered 2015-11-21: 40 mg via SUBCUTANEOUS
  Filled 2015-11-21: qty 0.4

## 2015-11-21 MED ORDER — MECLIZINE HCL 25 MG PO TABS
25.0000 mg | ORAL_TABLET | Freq: Once | ORAL | Status: AC
  Administered 2015-11-21: 25 mg via ORAL
  Filled 2015-11-21: qty 1

## 2015-11-21 MED ORDER — FERROUS SULFATE 325 (65 FE) MG PO TABS
325.0000 mg | ORAL_TABLET | Freq: Every day | ORAL | Status: DC
  Administered 2015-11-22: 325 mg via ORAL
  Filled 2015-11-21: qty 1

## 2015-11-21 MED ORDER — PANTOPRAZOLE SODIUM 40 MG PO TBEC
40.0000 mg | DELAYED_RELEASE_TABLET | Freq: Every day | ORAL | Status: DC
  Administered 2015-11-21 – 2015-11-22 (×2): 40 mg via ORAL
  Filled 2015-11-21 (×2): qty 1

## 2015-11-21 MED ORDER — ESCITALOPRAM OXALATE 10 MG PO TABS
10.0000 mg | ORAL_TABLET | Freq: Every day | ORAL | Status: DC
  Administered 2015-11-21 – 2015-11-22 (×2): 10 mg via ORAL
  Filled 2015-11-21 (×2): qty 1

## 2015-11-21 MED ORDER — ONDANSETRON HCL 4 MG/2ML IJ SOLN: 4.0000 mg | Freq: Three times a day (TID) | INTRAMUSCULAR | Status: DC | PRN

## 2015-11-21 MED ORDER — PROMETHAZINE HCL 25 MG/ML IJ SOLN
12.5000 mg | INTRAMUSCULAR | Status: DC | PRN
  Filled 2015-11-21: qty 1

## 2015-11-21 MED ORDER — ADULT MULTIVITAMIN W/MINERALS CH
1.0000 | ORAL_TABLET | Freq: Every day | ORAL | Status: DC
  Administered 2015-11-21 – 2015-11-22 (×2): 1 via ORAL
  Filled 2015-11-21 (×3): qty 1

## 2015-11-21 MED ORDER — PANTOPRAZOLE SODIUM 40 MG IV SOLR
40.0000 mg | Freq: Once | INTRAVENOUS | Status: AC
  Administered 2015-11-21: 40 mg via INTRAVENOUS
  Filled 2015-11-21: qty 40

## 2015-11-21 MED ORDER — SODIUM CHLORIDE 0.9 % IV BOLUS (SEPSIS)
1000.0000 mL | Freq: Once | INTRAVENOUS | Status: AC
  Administered 2015-11-21: 1000 mL via INTRAVENOUS

## 2015-11-21 MED ORDER — ONDANSETRON HCL 4 MG/2ML IJ SOLN: 4.0000 mg | Freq: Four times a day (QID) | INTRAMUSCULAR | Status: DC | PRN

## 2015-11-21 MED ORDER — SODIUM CHLORIDE 0.9 % IV SOLN
INTRAVENOUS | Status: DC
  Administered 2015-11-21 – 2015-11-22 (×2): via INTRAVENOUS

## 2015-11-21 MED ORDER — SODIUM CHLORIDE 0.9% FLUSH
3.0000 mL | Freq: Two times a day (BID) | INTRAVENOUS | Status: DC
  Administered 2015-11-21: 3 mL via INTRAVENOUS

## 2015-11-21 MED ORDER — SODIUM CHLORIDE 0.9 % IV SOLN
INTRAVENOUS | Status: AC
  Administered 2015-11-21: 19:00:00 via INTRAVENOUS

## 2015-11-21 MED ORDER — DONEPEZIL HCL 10 MG PO TABS
10.0000 mg | ORAL_TABLET | Freq: Every day | ORAL | Status: DC
  Administered 2015-11-21: 10 mg via ORAL
  Filled 2015-11-21: qty 1

## 2015-11-21 MED ORDER — ACETAMINOPHEN 325 MG PO TABS: 650.0000 mg | ORAL_TABLET | Freq: Four times a day (QID) | ORAL | Status: DC | PRN

## 2015-11-21 MED ORDER — OMEGA-3 FATTY ACIDS 1000 MG PO CAPS
2.0000 g | ORAL_CAPSULE | Freq: Every day | ORAL | Status: DC
  Administered 2015-11-22: 2 g via ORAL
  Filled 2015-11-21 (×4): qty 2

## 2015-11-21 MED ORDER — ASPIRIN EC 81 MG PO TBEC
81.0000 mg | DELAYED_RELEASE_TABLET | Freq: Every day | ORAL | Status: DC
  Administered 2015-11-21 – 2015-11-22 (×2): 81 mg via ORAL
  Filled 2015-11-21 (×2): qty 1

## 2015-11-21 MED ORDER — ANASTROZOLE 1 MG PO TABS
1.0000 mg | ORAL_TABLET | Freq: Every day | ORAL | Status: DC
  Administered 2015-11-21 – 2015-11-22 (×2): 1 mg via ORAL
  Filled 2015-11-21 (×2): qty 1

## 2015-11-21 MED ORDER — ONDANSETRON HCL 4 MG PO TABS: 4.0000 mg | ORAL_TABLET | Freq: Four times a day (QID) | ORAL | Status: DC | PRN

## 2015-11-21 MED ORDER — SODIUM CHLORIDE 0.9 % IV SOLN
Freq: Once | INTRAVENOUS | Status: AC
  Administered 2015-11-21: 15:00:00 via INTRAVENOUS

## 2015-11-21 MED ORDER — ACETAMINOPHEN 650 MG RE SUPP: 650.0000 mg | Freq: Four times a day (QID) | RECTAL | Status: DC | PRN

## 2015-11-21 NOTE — ED Notes (Signed)
Pt placed on bedpan, pt provided urine sample but sample was contaminated with stool. This RN and Journalist, newspaper attempted to quick cath patient and were unable to obtain urine.

## 2015-11-21 NOTE — Progress Notes (Signed)
NURSING PROGRESS NOTE  TENNLEY SALDIERNA [id number] Admission Data: 11/21/2015 6:52 PM Attending Provider: Cristal Ford, DO [id number], MD Code Status: Full   Evelyn White is a 75 y.o. female patient admitted from ED:  -No acute distress noted.  -No complaints of shortness of breath.  -No complaints of chest pain.   Cardiac Monitoring: Box # 15 in place. Cardiac monitor yields:normal sinus rhythm.  Blood pressure 164/89, pulse 97, temperature 97.9 F (36.6 C), temperature source Oral, resp. rate 19, height 5\' 3"  (1.6 m), weight 60.3 kg (132 lb 15 oz), SpO2 93 %.   IV Fluids:  IV in place, occlusive dsg intact without redness, IV cath forearm right, condition patent and no redness normal saline.   Allergies:  Review of patient's allergies indicates no known allergies.  Past Medical History:   has a past medical history of OA (osteoarthritis); Varicose veins; Left knee pain; BCC (basal cell carcinoma); Colon polyps (5/06); Anemia (05/2009); Gastric polyps (06/2009); Osteoporosis; GERD (gastroesophageal reflux disease); Breast cancer ([place]) (1995); and Breast cancer ([place]) (2016).  Past Surgical History:   has past surgical history that includes Upper gastrointestinal endoscopy (08/18/13); Tonsillectomy (age 25); Breast lumpectomy (1995); varicose vein treatment (1990's); Cataract extraction, bilateral (12/2013); and Radioactive seed guided excisional breast biopsy (Right, 03/25/2015).  Social History:   reports that she quit smoking about 34 years ago. Her smoking use included Cigarettes. She has a 2.5 pack-year smoking history. She has never used smokeless tobacco. She reports that she drinks alcohol. She reports that she does not use illicit drugs.  Skin: Intact  Patient/Family orientated to room. Information packet given to patient/family. Admission inpatient armband information verified with patient/family to include name and date of birth and placed on patient arm. Side rails  up x 2, fall assessment and education completed with patient/family. Patient/family able to verbalize understanding of risk associated with falls and verbalized understanding to call for assistance before getting out of bed. Call light within reach. Patient/family able to voice and demonstrate understanding of unit orientation instructions.    Will continue to evaluate and treat per MD orders.

## 2015-11-21 NOTE — ED Notes (Addendum)
Pt arrives via [facility] for c/o n/v/d and syncope. EMS reports patients son stated that patient had syncopal episode and fell back onto the bed at home, positive LOC 5-10 seconds. EMS reports pt had another syncopal episode in route that lasted approx 15 sec. Upon arrival to ED, pt had another syncopal episode that lasted 5 seconds. Pt alert/oriented. Pt received 4mg  zofran pta

## 2015-11-21 NOTE — ED Provider Notes (Signed)
CSN: [id number]     Arrival date & time 11/21/15  1237 History   First MD Initiated Contact with Patient 11/21/15 1305     Chief Complaint  Patient presents with  . Loss of Consciousness      HPI Pt arrives via [facility] for c/o n/v/d and syncope. EMS reports patients son stated that patient had syncopal episode and fell back onto the bed at home, positive LOC 5-10 seconds. EMS reports pt had another syncopal episode in route that lasted approx 15 sec. Upon arrival to ED, pt had another syncopal episode that lasted 5 seconds. Pt alert/oriented. Pt received 4mg  zofran pta Past Medical History  Diagnosis Date  . OA (osteoarthritis)     hands,feet  . Varicose veins   . Left knee pain     h/o-Dr.Collins  . BCC (basal cell carcinoma)     RLE-Dr.Lomax  . Colon polyps 5/06    hyperplastic  . Anemia 05/2009    iron deficient  . Gastric polyps 06/2009    small(Dr.Mann) EGD and Colonoscopy   . Osteoporosis     managed by Dr. Leo Grosser  . GERD (gastroesophageal reflux disease)   . Breast cancer ([place]) 1995    h/o left breast CA-s/p lumpectomy,radiation and Tamoxifen x 5years  . Breast cancer ([place]) 2016    [place] of right breast   Past Surgical History  Procedure Laterality Date  . Upper gastrointestinal endoscopy  08/18/13    large hiatal hernia  . Tonsillectomy  age 75  . Breast lumpectomy  1995    left breast  . Varicose vein treatment  1990's    LLE  . Cataract extraction, bilateral  12/2013    Dr. Luretha Rued  . Radioactive seed guided excisional breast biopsy Right 03/25/2015    Procedure: RADIOACTIVE SEED GUIDED RIGHT EXCISIONAL BREAST BIOPSY;  Surgeon: Rolm Bookbinder, MD;  Location: [facility];  Service: General;  Laterality: Right;   Family History  Problem Relation Age of Onset  . Hypertension Mother   . Osteoporosis Mother   . Alcohol abuse Son   . Heart disease Maternal Uncle   . Cancer Paternal Grandmother 50    breast cancer  . Diabetes Neg Hx   . Breast cancer Paternal Aunt      dx. age unknown; lim info   Social History  Substance Use Topics  . Smoking status: Former Smoker -- 0.50 packs/day for 5 years    Types: Cigarettes    Quit date: 10/23/1981  . Smokeless tobacco: Never Used  . Alcohol Use: 0.0 oz/week    0 Standard drinks or equivalent per week     Comment: 1.5 glasses of red wine daily   OB History    No data available     Review of Systems  Unable to perform ROS: Dementia      Allergies  Review of patient's allergies indicates no known allergies.  Home Medications   Prior to Admission medications   Medication Sig Start Date End Date Taking? Authorizing Provider  anastrozole (ARIMIDEX) 1 MG tablet Take 1 tablet (1 mg total) by mouth daily. 04/09/15  Yes Chauncey Cruel, MD  donepezil (ARICEPT) 10 MG tablet Take 1 tablet daily 06/02/15  Yes Rita Ohara, MD  escitalopram (LEXAPRO) 10 MG tablet TAKE 1/2 TABLET BY MOUTH EVERY MORNING. INCREASE TO 1 TABLET AFTER A WEEK IF TOLERATING 05/27/15  Yes Denita Lung, MD  ferrous sulfate 325 (65 FE) MG tablet Take 325 mg by mouth daily with  breakfast.   Yes Historical Provider, MD  omeprazole (PRILOSEC) 40 MG capsule Take 40 mg by mouth daily. Reported on 11/15/2015 04/03/14  Yes Historical Provider, MD  acetaminophen (TYLENOL) 325 MG tablet Take 325 mg by mouth as needed for mild pain or moderate pain. Reported on 11/15/2015    Historical Provider, MD  alendronate (FOSAMAX) 70 MG tablet Take 70 mg by mouth every 7 (seven) days.  07/19/13   Historical Provider, MD  aspirin 81 MG tablet Take 81 mg by mouth daily.    Historical Provider, MD  Biotin 5000 MCG TABS Take 1 tablet by mouth daily.    Historical Provider, MD  calcium carbonate (OS-CAL) 600 MG TABS Take 600 mg by mouth daily.     Historical Provider, MD  cholecalciferol (VITAMIN D) 1000 UNITS tablet Take 1,000 Units by mouth daily.    Historical Provider, MD  fish oil-omega-3 fatty acids 1000 MG capsule Take 2 g by mouth daily.     Historical  Provider, MD  Ginkgo Biloba 40 MG TABS Take 1 tablet by mouth daily. Reported on 11/15/2015    Historical Provider, MD  glucosamine-chondroitin 500-400 MG tablet Take 2 tablets by mouth daily.     Historical Provider, MD  Misc Natural Products (OSTEO BI-FLEX JOINT SHIELD PO) Take 2 tablets by mouth daily.    Historical Provider, MD  Multiple Vitamins-Minerals (MULTIVITAMIN WITH MINERALS) tablet Take 1 tablet by mouth daily.    Historical Provider, MD  naproxen sodium (ANAPROX) 220 MG tablet Take 220 mg by mouth as needed (pain). Reported on 11/15/2015    Historical Provider, MD  vitamin B-12 (CYANOCOBALAMIN) 100 MCG tablet Take 100 mcg by mouth daily. Reported on 11/15/2015    Historical Provider, MD  vitamin E 100 UNIT capsule Take 100 Units by mouth daily. Reported on 11/15/2015    Historical Provider, MD   BP 151/61 mmHg  Pulse 77  Temp(Src) 97.7 F (36.5 C)  Resp 18  SpO2 100% Physical Exam  Constitutional: She appears well-developed and well-nourished. No distress.  HENT:  Head: Normocephalic and atraumatic.  Eyes: Pupils are equal, round, and reactive to light.  Neck: Normal range of motion.  Cardiovascular: Normal rate and intact distal pulses.   Pulmonary/Chest: No respiratory distress.  Abdominal: Normal appearance. She exhibits no distension. There is no tenderness. There is no rebound and no guarding.  Musculoskeletal: Normal range of motion.  Neurological: She is alert. No cranial nerve deficit.  Patient has vertigo with movement of her head.  Skin: Skin is warm and dry. No rash noted.  Psychiatric: She has a normal mood and affect. Her behavior is normal.  Nursing note and vitals reviewed.   ED Course  Procedures (including critical care time) Labs Review Labs Reviewed  BASIC METABOLIC PANEL - Abnormal; Notable for the following:    Potassium 3.2 (*)    CO2 20 (*)    Glucose, Bld 150 (*)    Calcium 8.8 (*)    All other components within normal limits  CBC -  Abnormal; Notable for the following:    WBC 13.8 (*)    All other components within normal limits  CBG MONITORING, ED - Abnormal; Notable for the following:    Glucose-Capillary 121 (*)    All other components within normal limits  URINALYSIS, ROUTINE W REFLEX MICROSCOPIC (NOT AT [facility])  LIPASE, BLOOD  HEPATIC FUNCTION PANEL    Imaging Review Dg Chest 2 View  11/21/2015  CLINICAL DATA:  Syncopal episode. Cough.  Nausea and vomiting. Personal history of breast carcinoma. EXAM: CHEST  2 VIEW COMPARISON:  None. FINDINGS: Heart size is within normal limits. Moderate to large hiatal hernia is seen. Both lungs are clear. No evidence of pneumothorax or pleural effusion. Surgical clips noted within left axilla. IMPRESSION: No active lung disease. Moderate to large hiatal hernia. Electronically Signed   By: Earle Gell M.D.   On: 11/21/2015 15:08   I have personally reviewed and evaluated these images and lab results as part of my medical decision-making.   EKG Interpretation   Date/Time:  Sunday November 21 2015 12:46:30 EST Ventricular Rate:  79 PR Interval:  144 QRS Duration: 98 QT Interval:  417 QTC Calculation: 478 R Axis:   -18 Text Interpretation:  Sinus rhythm Ventricular premature complex  Borderline left axis deviation Borderline T wave abnormalities No previous  tracing Confirmed by Audie Pinto  MD, Dene Nazir ([id number]) on 11/21/2015 1:05:35 PM      MDM   Final diagnoses:  Vertigo  Nausea        Leonard Schwartz, MD 11/21/15 1545

## 2015-11-21 NOTE — ED Notes (Signed)
Patient now reports dizziness that occurs when she turns her head to the side.

## 2015-11-21 NOTE — ED Notes (Signed)
Patient transported to X-ray 

## 2015-11-21 NOTE — ED Notes (Signed)
MD at bedside. 

## 2015-11-21 NOTE — H&P (Signed)
[facility] History and Physical  Evelyn White [id number] DOB: 1941/06/13 DOA: 11/21/2015  Referring physician: Dr. Leonard Schwartz, EDP PCP: Vikki Ports, MD  Specialists:   Chief Complaint: Nausea, blacking out  HPI: Evelyn White is a 75 y.o. female  With a history of breast cancer, GERD, presented to the emergency department with complaints of nausea. She could not provide much history. Information given was by the family friend as well as son at bedside. It seems that yesterday, patient had gone out to lunch with her friend at that time she became very dizzy and nauseated. Her friend had to pullover thinking the patient had become carsick. She did not have any actual vomiting at that time. This morning, patient was found on the floor at her house by her son. He states she was awake but did not know what is going on. He also, she had an episode of diarrhea. We have picked her up and taken her to another room, she had yelled out his name, he then found her somewhat unresponsive with her eyes rolling to the back of her head. Patient's son stated that he had tried pushing on her stomach however she was awake. Supposedly while in the emergency department, patient had 2 similar episodes however did not lose any bowel or bladder control. She has not had a history of seizure in the past. Denies any recent travel or ill contacts. Patient does live alone. She does have poor memory. In the emergency department, chest x-ray is negative for infection, but noted large hiatal hernia. Patient continued to have nausea. [facility] called for admission.  Review of Systems:  Constitutional: Complains of poor appetite  HEENT: Complains of cough. Respiratory: Denies SOB, DOE, cough, chest tightness,  and wheezing.   Cardiovascular: Denies chest pain, palpitations and leg swelling.  Gastrointestinal: Has nausea, one episode of diarrhea.  Genitourinary: Denies dysuria, urgency, frequency, hematuria, flank  pain and difficulty urinating.  Musculoskeletal: Denies myalgias, back pain, joint swelling, arthralgias and gait problem.  Skin: Denies pallor, rash and wound.  Neurological: "black outs", memory loss. Hematological: Denies adenopathy. Easy bruising, personal or family bleeding history  Psychiatric/Behavioral: Denies suicidal ideation, mood changes, confusion, nervousness, sleep disturbance and agitation  Past Medical History  Diagnosis Date  . OA (osteoarthritis)     hands,feet  . Varicose veins   . Left knee pain     h/o-Dr.Collins  . BCC (basal cell carcinoma)     RLE-Dr.Lomax  . Colon polyps 5/06    hyperplastic  . Anemia 05/2009    iron deficient  . Gastric polyps 06/2009    small(Dr.Mann) EGD and Colonoscopy   . Osteoporosis     managed by Dr. Leo Grosser  . GERD (gastroesophageal reflux disease)   . Breast cancer ([place]) 1995    h/o left breast CA-s/p lumpectomy,radiation and Tamoxifen x 5years  . Breast cancer ([place]) 2016    [place] of right breast   Past Surgical History  Procedure Laterality Date  . Upper gastrointestinal endoscopy  08/18/13    large hiatal hernia  . Tonsillectomy  age 35  . Breast lumpectomy  1995    left breast  . Varicose vein treatment  1990's    LLE  . Cataract extraction, bilateral  12/2013    Dr. Luretha Rued  . Radioactive seed guided excisional breast biopsy Right 03/25/2015    Procedure: RADIOACTIVE SEED GUIDED RIGHT EXCISIONAL BREAST BIOPSY;  Surgeon: Rolm Bookbinder, MD;  Location: [facility];  Service: General;  Laterality: Right;   Social History:  reports that she quit smoking about 34 years ago. Her smoking use included Cigarettes. She has a 2.5 pack-year smoking history. She has never used smokeless tobacco. She reports that she drinks alcohol. She reports that she does not use illicit drugs.   No Known Allergies  Family History  Problem Relation Age of Onset  . Hypertension Mother   . Osteoporosis Mother   . Alcohol abuse Son   . Heart  disease Maternal Uncle   . Cancer Paternal Grandmother 9    breast cancer  . Diabetes Neg Hx   . Breast cancer Paternal Aunt     dx. age unknown; lim info    Prior to Admission medications   Medication Sig Start Date End Date Taking? Authorizing Provider  anastrozole (ARIMIDEX) 1 MG tablet Take 1 tablet (1 mg total) by mouth daily. 04/09/15  Yes Chauncey Cruel, MD  donepezil (ARICEPT) 10 MG tablet Take 1 tablet daily 06/02/15  Yes Rita Ohara, MD  escitalopram (LEXAPRO) 10 MG tablet TAKE 1/2 TABLET BY MOUTH EVERY MORNING. INCREASE TO 1 TABLET AFTER A WEEK IF TOLERATING 05/27/15  Yes Denita Lung, MD  ferrous sulfate 325 (65 FE) MG tablet Take 325 mg by mouth daily with breakfast.   Yes Historical Provider, MD  omeprazole (PRILOSEC) 40 MG capsule Take 40 mg by mouth daily. Reported on 11/15/2015 04/03/14  Yes Historical Provider, MD  acetaminophen (TYLENOL) 325 MG tablet Take 325 mg by mouth as needed for mild pain or moderate pain. Reported on 11/15/2015    Historical Provider, MD  alendronate (FOSAMAX) 70 MG tablet Take 70 mg by mouth every 7 (seven) days.  07/19/13   Historical Provider, MD  aspirin 81 MG tablet Take 81 mg by mouth daily.    Historical Provider, MD  Biotin 5000 MCG TABS Take 1 tablet by mouth daily.    Historical Provider, MD  calcium carbonate (OS-CAL) 600 MG TABS Take 600 mg by mouth daily.     Historical Provider, MD  cholecalciferol (VITAMIN D) 1000 UNITS tablet Take 1,000 Units by mouth daily.    Historical Provider, MD  fish oil-omega-3 fatty acids 1000 MG capsule Take 2 g by mouth daily.     Historical Provider, MD  Ginkgo Biloba 40 MG TABS Take 1 tablet by mouth daily. Reported on 11/15/2015    Historical Provider, MD  glucosamine-chondroitin 500-400 MG tablet Take 2 tablets by mouth daily.     Historical Provider, MD  Misc Natural Products (OSTEO BI-FLEX JOINT SHIELD PO) Take 2 tablets by mouth daily.    Historical Provider, MD  Multiple Vitamins-Minerals (MULTIVITAMIN  WITH MINERALS) tablet Take 1 tablet by mouth daily.    Historical Provider, MD  naproxen sodium (ANAPROX) 220 MG tablet Take 220 mg by mouth as needed (pain). Reported on 11/15/2015    Historical Provider, MD  vitamin B-12 (CYANOCOBALAMIN) 100 MCG tablet Take 100 mcg by mouth daily. Reported on 11/15/2015    Historical Provider, MD  vitamin E 100 UNIT capsule Take 100 Units by mouth daily. Reported on 11/15/2015    Historical Provider, MD   Physical Exam: Filed Vitals:   11/21/15 1530 11/21/15 1600  BP: 151/61 135/82  Pulse: 77 87  Temp:    Resp: 18 16     General: Well developed, well nourished, NAD, appears stated age  HEENT: NCAT, PERRLA, EOMI, Anicteic Sclera, mucous membranes moist.   Neck: Supple, no JVD, no masses  Cardiovascular: S1  S2 auscultated, no rubs, murmurs or gallops. Regular rate and rhythm.  Respiratory: Clear to auscultation bilaterally with equal chest rise  Abdomen: Soft, nontender, nondistended, + bowel sounds  Extremities: warm dry without cyanosis clubbing or edema  Neuro: AAOx1 (person), Patient cannot state date, DOB, place. cranial nerves grossly intact. Strength 5/5 in patient's upper and lower extremities bilaterally.  Skin: Without rashes exudates or nodules  Psych: Normal affect and demeanor with intact judgement and insight  Labs on Admission:  Basic Metabolic Panel:  Recent Labs Lab 11/21/15 1245  NA 139  K 3.2*  CL 106  CO2 20*  GLUCOSE 150*  BUN 6  CREATININE 0.68  CALCIUM 8.8*   Liver Function Tests: No results for input(s): AST, ALT, ALKPHOS, BILITOT, PROT, ALBUMIN in the last 168 hours. No results for input(s): LIPASE, AMYLASE in the last 168 hours. No results for input(s): AMMONIA in the last 168 hours. CBC:  Recent Labs Lab 11/21/15 1245  WBC 13.8*  HGB 12.4  HCT 39.3  MCV 91.4  PLT 383   Cardiac Enzymes: No results for input(s): CKTOTAL, CKMB, CKMBINDEX, TROPONINI in the last 168 hours.  BNP (last 3  results) No results for input(s): BNP in the last 8760 hours.  ProBNP (last 3 results) No results for input(s): PROBNP in the last 8760 hours.  CBG:  Recent Labs Lab 11/21/15 1403  GLUCAP 121*    Radiological Exams on Admission: Dg Chest 2 View  11/21/2015  CLINICAL DATA:  Syncopal episode. Cough. Nausea and vomiting. Personal history of breast carcinoma. EXAM: CHEST  2 VIEW COMPARISON:  None. FINDINGS: Heart size is within normal limits. Moderate to large hiatal hernia is seen. Both lungs are clear. No evidence of pneumothorax or pleural effusion. Surgical clips noted within left axilla. IMPRESSION: No active lung disease. Moderate to large hiatal hernia. Electronically Signed   By: Earle Gell M.D.   On: 11/21/2015 15:08   Ct Head Wo Contrast  11/21/2015  CLINICAL DATA:  75 year old female with syncope today. History of breast cancer. EXAM: CT HEAD WITHOUT CONTRAST TECHNIQUE: Contiguous axial images were obtained from the base of the skull through the vertex without intravenous contrast. COMPARISON:  12/06/2013 MR FINDINGS: Cerebral and cerebellar atrophy and chronic small-vessel white matter ischemic changes noted. No acute intracranial abnormalities are identified, including mass lesion or mass effect, hydrocephalus, extra-axial fluid collection, midline shift, hemorrhage, or acute infarction. The visualized bony calvarium is unremarkable. Near complete opacification of the left maxillary sinus is noted. IMPRESSION: No evidence of acute intracranial abnormality. Atrophy and chronic small-vessel white matter ischemic changes. Left maxillary sinusitis. Electronically Signed   By: Margarette Canada M.D.   On: 11/21/2015 15:57    EKG: Independently reviewed. Sinus rhythm, rate 79, PVC. No previous EKG for comparison  Assessment/Plan  Dizziness/?Syncope vs seizure -Patient will be admitted to telemetry for observation -Rule out seizure vs infection vs syncope vs worsening of dementia -CT head  showed left maxillary sinusitis, no acute intracranial normality -Will conduct neuro checks q4hours, place on seizure precautions  -Obtain orthostatic vitals -Obtain TSH, B12, ammonia levels -CXR shows no infection -Have ordered UA -PT and OT consulted for evaluation and treatment -Obtain EEG  Nausea/GERD -Possibly related to dizziness vs viral gastroenteritis as patient also had one episode of diarrhea -continue antiemetics PRN -CXR showed moderate to large hiatal hernia -Continue PPI  Leukocytosis -Chest x-rays above, pending UA -Possibly reactive, the monitor CBC  Dementia -continue Aricept   History of Breast cancer -Continue  arimidex -Follow up with oncology at discharge   Depression  -continue Lexapro   DVT prophylaxis: Lovenox  Code Status: Full  Condition: Guarded  Family Communication: Family at bedside. Admission, patients condition and plan of care including tests being ordered have been discussed with the patient and family who indicate understanding and agree with the plan and Code Status.  Disposition Plan: Admitted for observation   Time spent: 65 minutes  Dawaun Brancato D.O. [facility] Pager [phone]  If 7PM-7AM, please contact night-coverage [email or web address] Password [email or web address] 11/21/2015, 4:04 PM

## 2015-11-22 ENCOUNTER — Ambulatory Visit (HOSPITAL_COMMUNITY): Payer: Self-pay

## 2015-11-22 DIAGNOSIS — R413 Other amnesia: Secondary | ICD-10-CM | POA: Diagnosis not present

## 2015-11-22 DIAGNOSIS — R4189 Other symptoms and signs involving cognitive functions and awareness: Secondary | ICD-10-CM | POA: Diagnosis not present

## 2015-11-22 DIAGNOSIS — R55 Syncope and collapse: Secondary | ICD-10-CM

## 2015-11-22 DIAGNOSIS — K529 Noninfective gastroenteritis and colitis, unspecified: Secondary | ICD-10-CM

## 2015-11-22 DIAGNOSIS — R42 Dizziness and giddiness: Secondary | ICD-10-CM

## 2015-11-22 DIAGNOSIS — E7220 Disorder of urea cycle metabolism, unspecified: Secondary | ICD-10-CM

## 2015-11-22 LAB — BASIC METABOLIC PANEL
ANION GAP: 13 (ref 5–15)
BUN: <5 mg/dL — ABNORMAL LOW (ref 6–20)
CO2: 23 mmol/L (ref 22–32)
Calcium: 8.3 mg/dL — ABNORMAL LOW (ref 8.9–10.3)
Chloride: 104 mmol/L (ref 101–111)
Creatinine, Ser: 0.57 mg/dL (ref 0.44–1.00)
GFR calc Af Amer: >60 mL/min (ref >60)
GLUCOSE: 99 mg/dL (ref 65–99)
POTASSIUM: 3.1 mmol/L — AB (ref 3.5–5.1)
Sodium: 140 mmol/L (ref 135–145)

## 2015-11-22 LAB — CBC
HEMATOCRIT: 40.2 % (ref 36.0–46.0)
HEMOGLOBIN: 13.1 g/dL (ref 12.0–15.0)
MCH: 29.9 pg (ref 26.0–34.0)
MCHC: 32.6 g/dL (ref 30.0–36.0)
MCV: 91.8 fL (ref 78.0–100.0)
Platelets: 363 10*3/uL (ref 150–400)
RBC: 4.38 MIL/uL (ref 3.87–5.11)
RDW: 12.9 % (ref 11.5–15.5)
WBC: 12.4 10*3/uL — ABNORMAL HIGH (ref 4.0–10.5)

## 2015-11-22 LAB — AMMONIA: Ammonia: 40 umol/L — ABNORMAL HIGH (ref 9–35)

## 2015-11-22 MED ORDER — ONDANSETRON 4 MG PO TBDP: 4.0000 mg | ORAL_TABLET | Freq: Three times a day (TID) | ORAL | Status: DC | PRN

## 2015-11-22 MED ORDER — POTASSIUM CHLORIDE CRYS ER 20 MEQ PO TBCR
40.0000 meq | EXTENDED_RELEASE_TABLET | Freq: Once | ORAL | Status: AC
  Administered 2015-11-22: 40 meq via ORAL
  Filled 2015-11-22: qty 2

## 2015-11-22 MED ORDER — LACTULOSE 10 GM/15ML PO SOLN: 10.0000 g | Freq: Two times a day (BID) | ORAL | Status: DC | PRN

## 2015-11-22 NOTE — Evaluation (Signed)
Physical Therapy Evaluation Patient Details Name: Evelyn White MRN: [id number] DOB: 1941/06/14 Today's Date: 11/22/2015   History of Present Illness  Pt is a 75 yo female admitted after a "blacking out" episode.  Pt found on floor by her son.  Pt with nause and dizziness.  Pt has had diarrhea when here but states she has it alot.  Pt found to have large hiatal hernia (which is not new).  Pt currently lives alone but should no longer be living alone secondary to very poor memory.  Clinical Impression  Pt doing well with mobility and no further PT needed.  Will need supervised setting for cognition. Son plans to live with pt and then place in ALF.    Follow Up Recommendations Supervision/Assistance - 24 hour (for cognition)    Equipment Recommendations  None recommended by PT    Recommendations for Other Services       Precautions / Restrictions Precautions Precautions: Other (comment) (getting lost in car or by foot) Precaution Comments: Pt with very poor memory and problem solving.  Pt unable to recall one word with a 15 second delay. Restrictions Weight Bearing Restrictions: No Other Position/Activity Restrictions: pt should have 24/7 S for safety.      Mobility  Bed Mobility Overal bed mobility: Independent             General bed mobility comments: no assist needed.  Transfers Overall transfer level: Modified independent Equipment used: None Transfers: Sit to/from Stand Sit to Stand: Modified independent (Device/Increase time)         General transfer comment: Pt had IV pole to hang onto when walking.    Ambulation/Gait Ambulation/Gait assistance: Modified independent (Device/Increase time) Ambulation Distance (Feet): 1000 Feet Assistive device: None Gait Pattern/deviations: WFL(Within Functional Limits)   Gait velocity interpretation: at or above normal speed for age/gender General Gait Details: Steady gait. Head turns, speed changes, weaving without  difficulty  Stairs Stairs: Yes Stairs assistance: Modified independent (Device/Increase time) Stair Management: One rail Right;Alternating pattern;Forwards Number of Stairs: 5    Wheelchair Mobility    Modified Rankin (Stroke Patients Only)       Balance Overall balance assessment: Modified Independent                                           Pertinent Vitals/Pain Pain Assessment: No/denies pain    Home Living Family/patient expects to be discharged to:: Private residence Living Arrangements: Alone;Other (Comment) (son states he can live w her until placed in alf) Available Help at Discharge: Family;Available 24 hours/day Type of Home: House Home Access: Stairs to enter Entrance Stairs-Rails: None Entrance Stairs-Number of Steps: 2 Home Layout: Two level;Able to live on main level with bedroom/bathroom Home Equipment: None Additional Comments: Pt has a son that can stay with her 24/7 for short term.    Prior Function Level of Independence: Independent         Comments: Getting lost in car.     Hand Dominance   Dominant Hand: Right    Extremity/Trunk Assessment   Upper Extremity Assessment: Defer to OT evaluation           Lower Extremity Assessment: Overall WFL for tasks assessed      Cervical / Trunk Assessment: Normal  Communication   Communication: No difficulties  Cognition Arousal/Alertness: Awake/alert Behavior During Therapy:  (very litte insight but did state  her memory was worse.) Overall Cognitive Status: Impaired/Different from baseline Area of Impairment: Orientation;Memory;Safety/judgement;Awareness;Problem solving Orientation Level: Disoriented to;Place;Time;Situation   Memory: Decreased recall of precautions;Decreased short-term memory   Safety/Judgement: Decreased awareness of safety;Decreased awareness of deficits Awareness: Intellectual Problem Solving: Slow processing General Comments: Pt has a very  classic way of trying to cover her memory loss.  Pt does laugh at herself when she forgets things and rationalizes why she forgets things.  Children have been discussing ALF for the past few months.  Feel this may be a good option if pt does not have strict 24/7 S.    General Comments General comments (skin integrity, edema, etc.): Pt physically does very well with adls.  cognition is significantly impaired.    Exercises        Assessment/Plan    PT Assessment Patent does not need any further PT services  PT Diagnosis Altered mental status   PT Problem List    PT Treatment Interventions     PT Goals (Current goals can be found in the Care Plan section) Acute Rehab PT Goals Patient Stated Goal: to be able to do what I did before. PT Goal Formulation: All assessment and education complete, DC therapy    Frequency     Barriers to discharge        Co-evaluation               End of Session   Activity Tolerance: Patient tolerated treatment well Patient left: in chair;with call bell/phone within reach;with chair alarm set;with family/visitor present      Functional Assessment Tool Used: clinical judgement Functional Limitation: Mobility: Walking and moving around Mobility: Walking and Moving Around Current Status [id number]): 0 percent impaired, limited or restricted Mobility: Walking and Moving Around Goal Status [phone]): 0 percent impaired, limited or restricted Mobility: Walking and Moving Around Discharge Status [phone]): 0 percent impaired, limited or restricted    Time: 1221-1232 PT Time Calculation (min) (ACUTE ONLY): 11 min   Charges:   PT Evaluation $PT Eval Low Complexity: 1 Procedure     PT G Codes:   PT G-Codes **NOT FOR INPATIENT CLASS** Functional Assessment Tool Used: clinical judgement Functional Limitation: Mobility: Walking and moving around Mobility: Walking and Moving Around Current Status [id number]): 0 percent impaired, limited or  restricted Mobility: Walking and Moving Around Goal Status [id number]): 0 percent impaired, limited or restricted Mobility: Walking and Moving Around Discharge Status [id number]): 0 percent impaired, limited or restricted    [facility] 11/22/2015, 12:47 PM [facility] PT [phone]

## 2015-11-22 NOTE — Progress Notes (Signed)
Pt refused her fish oil omega3 pt son said it is not necessary for his mom to take it, there he told the mom to refuse it, will continue to monitor

## 2015-11-22 NOTE — Care Management Note (Signed)
Case Management Note  Patient Details  Name: Evelyn White MRN: [id number] Date of Birth: [birth year]  Subjective/Objective:                 Pt from home alone. Found on floor by son after syncopal episode. PTA independent with ADL's. No DME usage. Hx of breast ca, dementia (short memory deficits).   Action/Plan: Return to home when medically stable. CM to f/u with disposition needs.  Expected Discharge Date:                  Expected Discharge Plan:  [place]  In-House Referral:  Clinical Social Work (ALF/memory care unit)  Discharge planning Services  CM Consult  Post Acute Care Choice:    Choice offered to:     DME Arranged:    DME Agency:     HH Arranged:    HH Agency:     Status of Service:  In process, will continue to follow  Medicare Important Message Given:    Date Medicare IM Given:    Medicare IM give by:    Date Additional Medicare IM Given:    Additional Medicare Important Message give by:     If discussed at [place] of Stay Meetings, dates discussed:    Additional Comments: Rita Ohara PCP   CM spoke to pt and son regarding d/c planning.Son stated plan is for mom to be d/c to home with 24/7 avaliability from family or private duty services. CM made referral to [place] @ son's request for ALF/MEMORY CARE UNIT facilities. CM provided information / listings of private duty services to son. CM received consult for hh needs.CM made referral to [place] for [facility] and SW per MD's order.    Whitman Hero [place], [place] [phone] 11/22/2015, 2:39 PM

## 2015-11-22 NOTE — Progress Notes (Signed)
   11/22/15 1500  Clinical Encounter Type  Visited With Patient and family together;Family  Visit Type (Adv. Directive)  Referral From Patient;Family;Nurse  Spiritual Encounters  Spiritual Needs Literature (Adv. Directive materials)  [place] responded to consult for ADV DIR; pt wants to update HCPOA; New forms given and explained; pt will complete after D/C. 3:17 PM Gwynn Burly

## 2015-11-22 NOTE — Care Management Obs Status (Signed)
[facility] NOTIFICATION   Patient Details  Name: Evelyn White MRN: [id number] Date of Birth: 1941-01-30   Medicare Observation Status Notification Given:  Yes    Sharin Mons, RN 11/22/2015, 2:02 PM

## 2015-11-22 NOTE — Progress Notes (Addendum)
NURSING PROGRESS NOTE  Evelyn White [id number] Discharge Data: 11/22/2015 4:28 PM Attending Provider: Janece Canterbury, MD [id number], MD     Melvenia Beam to be D/C'd Home with son per MD order.  Discussed with the patient and the patient's son the After Visit Summary and all questions fully answered. All IV's discontinued with no bleeding noted. All belongings returned to patient for patient to take home.   Last Vital Signs:  Blood pressure 159/78, pulse 79, temperature 98.2 F (36.8 C), temperature source Oral, resp. rate 16, height 5\' 3"  (1.6 m), weight 59.9 kg (132 lb 0.9 oz), SpO2 93 %.  Discharge Medication List   Medication List    STOP taking these medications        Biotin 5000 MCG Tabs     calcium carbonate 600 MG Tabs tablet  Commonly known as:  OS-CAL     cholecalciferol 1000 units tablet  Commonly known as:  VITAMIN D     escitalopram 10 MG tablet  Commonly known as:  LEXAPRO     ferrous sulfate 325 (65 FE) MG tablet     fish oil-omega-3 fatty acids 1000 MG capsule     Ginkgo Biloba 40 MG Tabs     glucosamine-chondroitin 500-400 MG tablet     naproxen sodium 220 MG tablet  Commonly known as:  ANAPROX     OSTEO BI-FLEX JOINT SHIELD PO     vitamin B-12 100 MCG tablet  Commonly known as:  CYANOCOBALAMIN     vitamin E 100 UNIT capsule      TAKE these medications        acetaminophen 325 MG tablet  Commonly known as:  TYLENOL  Take 325 mg by mouth as needed for mild pain or moderate pain. Reported on 11/15/2015     alendronate 70 MG tablet  Commonly known as:  FOSAMAX  Take 70 mg by mouth every 7 (seven) days.     anastrozole 1 MG tablet  Commonly known as:  ARIMIDEX  Take 1 tablet (1 mg total) by mouth daily.     aspirin 81 MG tablet  Take 81 mg by mouth daily.     donepezil 10 MG tablet  Commonly known as:  ARICEPT  Take 1 tablet daily     lactulose 10 GM/15ML solution  Commonly known as:  CHRONULAC  Take 15 mLs (10 g total)  by mouth 2 (two) times daily as needed for mild constipation.     multivitamin with minerals tablet  Take 1 tablet by mouth daily.     omeprazole 40 MG capsule  Commonly known as:  PRILOSEC  Take 40 mg by mouth daily. Reported on 11/15/2015     ondansetron 4 MG disintegrating tablet  Commonly known as:  ZOFRAN ODT  Take 1 tablet (4 mg total) by mouth every 8 (eight) hours as needed for nausea or vomiting.         Doristine Devoid, RN

## 2015-11-22 NOTE — Evaluation (Signed)
Occupational Therapy Evaluation Patient Details Name: Evelyn White MRN: [id number] DOB: 09/18/1941 Today's Date: 11/22/2015    History of Present Illness Pt is a 75 yo female admitted after a "blacking out" episode.  Pt found on floor by her son.  Pt with nause and dizziness.  Pt has had diarrhea when here but states she has it alot.  Pt found to have large hiatal hernia (which is not new).  Pt currently lives alone but should no longer be living alone secondary to very poor memory.   Clinical Impression   Pt admitted with the above diagnosis and has the deficits listed below. Pt would benefit from cont OT to increase memory compensation techniques so she can d/c home with son providing 24 hour S and be safe.  Pt with profound memory issues on eval today.  Pt unable to recall items, people, words that are presented 15 seconds prior.  Pt did not recall her husband was deceased, could not state the set up of her home and had no idea of the date, her location or why she was in the hospital.  [place] and states she gets lost driving.  Pt should no longer be driving and will need strict 24/hr S at discharge. Spoke with family about long term plans and the eventual need for a memory unit if they cannot continue to provide 24 hr S.  Son agrees.    Follow Up Recommendations  Outpatient OT;Supervision/Assistance - 24 hour    Equipment Recommendations  None recommended by OT    Recommendations for Other Services Speech consult     Precautions / Restrictions Precautions Precautions: Other (comment) (getting lost in car or by foot) Precaution Comments: Pt with very poor memory and problem solving.  Pt unable to recall one word with a 15 second delay. Restrictions Weight Bearing Restrictions: No Other Position/Activity Restrictions: pt should have 24/7 S for safety.      Mobility Bed Mobility Overal bed mobility: Independent             General  bed mobility comments: no assist needed.  Transfers Overall transfer level: Needs assistance Equipment used: None Transfers: Sit to/from Stand Sit to Stand: Supervision         General transfer comment: Pt had IV pole to hang onto when walking.      Balance Overall balance assessment: No apparent balance deficits (not formally assessed)                                          ADL Overall ADL's : Needs assistance/impaired Eating/Feeding: Set up;Sitting   Grooming: Wash/dry hands;Wash/dry face;Oral care;Brushing hair;Supervision/safety;Standing (cuing to remember what she is at sink for) Grooming Details (indicate cue type and reason): Pt can do all tasks without physical assist.  She does require cues to remember that she has not brushed her teeth, or taken her meds, or combed her hair.  She is at risk for taking meds too many times if they are not managed by someone else.  She will forget she has taken them. Upper Body Bathing: Supervision/ safety;Sitting   Lower Body Bathing: Supervison/ safety;Sit to/from stand   Upper Body Dressing : Supervision/safety;Sitting   Lower Body Dressing: Supervision/safety;Sit to/from stand   Toilet Transfer: Supervision/safety;Ambulation;Comfort height toilet   Toileting- Clothing Manipulation and Hygiene: Supervision/safety;Sit to/from stand     Tub/ Shower Transfer: Supervision/safety;Ambulation;Walk-in shower   Functional mobility during ADLs: Supervision/safety General ADL Comments: Again, pt can physically complete all adls with S.  Supervision is required  to keep pt on task.  Pt cannot recall what she has and has not done.  Therapist left room and returned and was a new person to the pt.       Vision Vision Assessment?: No apparent visual deficits   Perception Perception Perception Tested?: No   Praxis Praxis Praxis tested?: Within functional limits    Pertinent Vitals/Pain Pain Assessment: No/denies pain      Hand Dominance Right   Extremity/Trunk Assessment Upper Extremity Assessment Upper Extremity Assessment: Overall WFL for tasks assessed   Lower Extremity Assessment Lower Extremity Assessment: Defer to PT evaluation   Cervical / Trunk Assessment Cervical / Trunk Assessment: Normal   Communication Communication Communication: No difficulties   Cognition Arousal/Alertness: Awake/alert Behavior During Therapy:  (very litte insight but did state her memory was worse.) Overall Cognitive Status: Impaired/Different from baseline Area of Impairment: Orientation;Memory;Safety/judgement;Awareness;Problem solving Orientation Level: Disoriented to;Place;Time;Situation   Memory: Decreased recall of precautions;Decreased short-term memory   Safety/Judgement: Decreased awareness of safety;Decreased awareness of deficits Awareness: Intellectual Problem Solving: Slow processing General Comments: Pt has a very classic way of trying to cover her memory loss.  Pt does laugh at herself when she forgets things and rationalizes why she forgets things.  Children have been discussing ALF for the past few months.  Feel this may be a good option if pt does not have strict 24/7 S.   General Comments       Exercises       Shoulder Instructions      Home Living Family/patient expects to be discharged to:: Private residence Living Arrangements: Alone;Other (Comment) (son states he can live w her until placed in alf) Available Help at Discharge: Family;Available 24 hours/day Type of Home: House Home Access: Stairs to enter [employer] of Steps: 2 Entrance Stairs-Rails: None Home Layout: Two level;Able to live on main level with bedroom/bathroom     Bathroom Shower/Tub: Walk-in shower;Door   [employer] Toilet: Standard     Home Equipment: None   Additional Comments: Pt has a son that can stay with her 24/7 for short term.      Prior Functioning/Environment Level of  Independence: Independent        Comments: Pt has lives alone for some time now.  Diagnosis w dementia 2 years ago.  Family states  she has progressed with poor memory over last 2 months.  Pt could not remember year/date/place, set up of her home, if her husband was alive or not etc at time of eval or with 30 second delay after being told.  Ironically, this pt lives in my neighborhood and I had a run in with her around [place] Years Day while taking care of her next door neighbors dog(did not realize this until met pt again today).  On that date, while being friendly walking the dog, I attempted to talk to  pt.  Pt was very confused, repeating self, and pt did not recall beginning parts of our conversation at that time.  Pt does drive, has recently become lost when driving per her report and also got lost in [place] airport over the holidays.       OT Diagnosis: Cognitive deficits;Altered mental status   OT Problem List: Decreased cognition;Decreased safety awareness;Decreased knowledge of use of DME or AE   OT Treatment/Interventions: Self-care/ADL training;Cognitive remediation/compensation;Patient/family  education    OT Goals(Current goals can be found in the care plan section) Acute Rehab OT Goals Patient Stated Goal: to be able to do what I did before. OT Goal Formulation: With patient/family Time For Goal Achievement: 12/06/15 Potential to Achieve Goals: Fair ADL Goals Additional ADL Goal #1: Pt will be introduced to simple memory book and how to use it to record daily happenings. Additional ADL Goal #2: Pt will be provided with basic calendar to keep bedside with dates X'd off to attempt to be able to state date with min cues. Additional ADL Goal #3: Son will be further educated on safety precautions to take if he is to take pt home for awhile.  OT Frequency: Min 2X/week   Barriers to D/C: Decreased caregiver support  Pts son did say he could live with pt short term while seeking out  ALF.       Co-evaluation              End of Session Nurse Communication: Mobility status;Other (comment) (need for bed/chair alarm due to decreased memory)  Activity Tolerance: Patient tolerated treatment well Patient left: in chair;with call bell/phone within reach;with chair alarm set   Time: 0958-1055 OT Time Calculation (min): 57 min Charges:  OT General Charges $OT Visit: 1 Procedure OT Evaluation $OT Eval Moderate Complexity: 1 Procedure OT Treatments $Self Care/Home Management : 23-37 mins G-Codes: OT G-codes **NOT FOR INPATIENT CLASS** Functional Assessment Tool Used: clinical judgement Functional Limitation: Self care Self Care Current Status (G8987): At least 1 percent but less than 20 percent impaired, limited or restricted Self Care Goal Status (G8988): At least 1 percent but less than 20 percent impaired, limited or restricted  ,  Anne 11/22/2015, 12:16 PM  [phone]    

## 2015-11-22 NOTE — Discharge Summary (Signed)
Physician Discharge Summary  Evelyn White [id number] DOB: 11-May-1941 DOA: 11/21/2015  PCP: Vikki Ports, MD  Admit date: 11/21/2015 Discharge date: 11/22/2015  Recommendations for Outpatient Follow-up:  1. Son plans to transition her to dementia unit, [facility] RN and SW ordered to assist with escalation of care 2. 24 hour supervision, including meals and medications 3. PCP in 2 weeks to review medication list, blood pressure, repeat CBC and BMP.    Discharge Diagnoses:  Active Problems:   Memory loss   Major depressive disorder, single episode, moderate (HCC)   Cognitive decline   Dizzinesses   Discharge Condition: stable, improved  Diet recommendation: regular  Wt Readings from Last 3 Encounters:  11/22/15 59.9 kg (132 lb 0.9 oz)  11/15/15 59.693 kg (131 lb 9.6 oz)  08/04/15 62.869 kg (138 lb 9.6 oz)    History of present illness:   Evelyn White is a 75 y.o. female with a history of breast cancer, GERD, who presented to the emergency department with complaint of nausea.  She was found at home by her son, sitting on the floor in front the refrigerator in vomit, stool and urine, awake and alert but confused.  He assisted her to the bedroom so she could change into fresh clothes.  He left the room so she could change, but then rus He had tried pushing on her stomach however she was awake. Supposedly while in the emergency department, patient had 2 similar episodes however did not lose any bowel or bladder control. She had no history of seizures.  In the emergency department, chest x-ray was negative for infection, urinalysis negative, but noted large hiatal hernia. Patient continued to have nausea.    Hospital Course:   Dizziness/?Syncope vs seizure.  Per history, her events sound like syncope, likely fainting spells in the setting of gastroenteritis and dehydration.  She had a mild leukocytosis and some residual diarrhea during her hospitalization, which was C. Diff negative.   Her diarrhea slowed down and she was able to tolerate solid foods within 24 hours.  Her head CT demonstrated left maxillary sinusitis but no acute intracranial abnormality.  After IVF, her orthostatics were negative.  Her TSH, B12 levels were wnl.  Her ammonia level was very mildly elevated which was likely a false positive in the setting of GI illness, dehydration.  She had no asterixis or other hallmarks of hepatic encephalopathy to suggest this was the etiology for her confusion.  She was given a prescription for lactulose to use as needed for constipation as opposed to colace or senna or OTC medication.  PT/OT recommended no follow up and 24 hour supervision.  She had no further staring spells, but son was made aware that patients with dementia can forget to eat and drink and may mistake their medications leading to low blood pressures and fainting spells.  They are also prone to staring spells.  I discontinued her EEG for now and can be completed as an outpatient if increased suspicion for seizures.    Gastroenteritis, improved but not totally resolved at discharge and likely due to viral illness.  She had no abdominal tenderness on exam and was able to eat and drink.  Her diarrhea slowed.  She remained afebrile and her leukocytosis improved.    GERD, she continued PPI.  Leukocytosis, CXR and UA negative.  Likely due to gastroenteritis and trended down without antibiotics.  Dementia, progressive, continued Aricept.  She needs 24 hour supervision and her son is willing to provide  that in the [facility] term while working on getting her into a dementia unit with the assistance of social work.  I have trimmed her medication list of some of her vitamins and supplements for now to ease her pill burden.  These medications can be resumed as needed as an outpatient by her supervising physician.    History of Breast cancer, stable, continued arimidex  Depression, stable, stopped Lexapro because she had been  noncompliant with the medication at home.   Elevated blood pressures, may be due to anxiety from hospitalization and from acute illness.  Recheck as outpatient and if still very elevated, discuss antihypertensive with son.    Procedures:  CT head  CXR  Consultations:  none  Discharge Exam: Filed Vitals:   11/22/15 0848 11/22/15 1419  BP: 158/81 159/78  Pulse: 98 79  Temp: 98 F (36.7 C) 98.2 F (36.8 C)  Resp: 17 16   Filed Vitals:   11/22/15 0044 11/22/15 0348 11/22/15 0848 11/22/15 1419  BP: 173/70 168/60 158/81 159/78  Pulse: 81 74 98 79  Temp: 98.4 F (36.9 C) 98.8 F (37.1 C) 98 F (36.7 C) 98.2 F (36.8 C)  TempSrc: Oral Oral Oral Oral  Resp: 16 16 17 16   Height:      Weight:  59.9 kg (132 lb 0.9 oz)    SpO2: 100% 98% 97% 93%    General: adult female, NAD, sitting up in chair, frequently trying to get out of chair despite reminders Cardiovascular: RRR, no mrg Respiratory: CTAB ABD:  NABS, soft, ND/NT MSK:  No LEE, normal tone and bulk Psych:  Awake, alert, quick to answer, pleasantly confused.  Oriented to person only.   Discharge Instructions      Discharge Instructions    Call MD for:  difficulty breathing, headache or visual disturbances    Complete by:  As directed      Call MD for:  extreme fatigue    Complete by:  As directed      Call MD for:  hives    Complete by:  As directed      Call MD for:  persistant dizziness or light-headedness    Complete by:  As directed      Call MD for:  persistant nausea and vomiting    Complete by:  As directed      Call MD for:  severe uncontrolled pain    Complete by:  As directed      Call MD for:  temperature >100.4    Complete by:  As directed      Diet - low sodium heart healthy    Complete by:  As directed      Increase activity slowly    Complete by:  As directed             Medication List    STOP taking these medications        Biotin 5000 MCG Tabs     calcium carbonate 600 MG Tabs  tablet  Commonly known as:  OS-CAL     cholecalciferol 1000 units tablet  Commonly known as:  VITAMIN D     escitalopram 10 MG tablet  Commonly known as:  LEXAPRO     ferrous sulfate 325 (65 FE) MG tablet     fish oil-omega-3 fatty acids 1000 MG capsule     Ginkgo Biloba 40 MG Tabs     glucosamine-chondroitin 500-400 MG tablet     naproxen sodium 220 MG tablet  Commonly known as:  ANAPROX     OSTEO BI-FLEX JOINT SHIELD PO     vitamin B-12 100 MCG tablet  Commonly known as:  CYANOCOBALAMIN     vitamin E 100 UNIT capsule      TAKE these medications        acetaminophen 325 MG tablet  Commonly known as:  TYLENOL  Take 325 mg by mouth as needed for mild pain or moderate pain. Reported on 11/15/2015     alendronate 70 MG tablet  Commonly known as:  FOSAMAX  Take 70 mg by mouth every 7 (seven) days.     anastrozole 1 MG tablet  Commonly known as:  ARIMIDEX  Take 1 tablet (1 mg total) by mouth daily.     aspirin 81 MG tablet  Take 81 mg by mouth daily.     donepezil 10 MG tablet  Commonly known as:  ARICEPT  Take 1 tablet daily     lactulose 10 GM/15ML solution  Commonly known as:  CHRONULAC  Take 15 mLs (10 g total) by mouth 2 (two) times daily as needed for mild constipation.     multivitamin with minerals tablet  Take 1 tablet by mouth daily.     omeprazole 40 MG capsule  Commonly known as:  PRILOSEC  Take 40 mg by mouth daily. Reported on 11/15/2015     ondansetron 4 MG disintegrating tablet  Commonly known as:  ZOFRAN ODT  Take 1 tablet (4 mg total) by mouth every 8 (eight) hours as needed for nausea or vomiting.       Follow-up Information    Follow up with [name],[name], MD. Schedule an appointment as soon as possible for a visit in 2 weeks.   Specialty:  Family Medicine   Contact information:   [place] [phone]        The results of significant diagnostics from this hospitalization (including imaging,  microbiology, ancillary and laboratory) are listed below for reference.    Significant Diagnostic Studies: Dg Chest 2 View  11/21/2015  CLINICAL DATA:  Syncopal episode. Cough. Nausea and vomiting. Personal history of breast carcinoma. EXAM: CHEST  2 VIEW COMPARISON:  None. FINDINGS: Heart size is within normal limits. Moderate to large hiatal hernia is seen. Both lungs are clear. No evidence of pneumothorax or pleural effusion. Surgical clips noted within left axilla. IMPRESSION: No active lung disease. Moderate to large hiatal hernia. Electronically Signed   By: Earle Gell M.D.   On: 11/21/2015 15:08   Ct Head Wo Contrast  11/21/2015  CLINICAL DATA:  75 year old female with syncope today. History of breast cancer. EXAM: CT HEAD WITHOUT CONTRAST TECHNIQUE: Contiguous axial images were obtained from the base of the skull through the vertex without intravenous contrast. COMPARISON:  12/06/2013 MR FINDINGS: Cerebral and cerebellar atrophy and chronic small-vessel white matter ischemic changes noted. No acute intracranial abnormalities are identified, including mass lesion or mass effect, hydrocephalus, extra-axial fluid collection, midline shift, hemorrhage, or acute infarction. The visualized bony calvarium is unremarkable. Near complete opacification of the left maxillary sinus is noted. IMPRESSION: No evidence of acute intracranial abnormality. Atrophy and chronic small-vessel white matter ischemic changes. Left maxillary sinusitis. Electronically Signed   By: Margarette Canada M.D.   On: 11/21/2015 15:57    Microbiology: No results found for this or any previous visit (from the past 240 hour(s)).   Labs: Basic Metabolic Panel:  Recent Labs Lab 11/21/15 1245 11/22/15 0750  NA 139  140  K 3.2* 3.1*  CL 106 104  CO2 20* 23  GLUCOSE 150* 99  BUN 6 <5*  CREATININE 0.68 0.57  CALCIUM 8.8* 8.3*   Liver Function Tests:  Recent Labs Lab 11/21/15 1724  AST 21  ALT 16  ALKPHOS 84  BILITOT 0.6   PROT 6.1*  ALBUMIN 3.2*    Recent Labs Lab 11/21/15 1724  LIPASE 32    Recent Labs Lab 11/22/15 0750  AMMONIA 40*   CBC:  Recent Labs Lab 11/21/15 1245 11/22/15 0750  WBC 13.8* 12.4*  HGB 12.4 13.1  HCT 39.3 40.2  MCV 91.4 91.8  PLT 383 363   Cardiac Enzymes: No results for input(s): CKTOTAL, CKMB, CKMBINDEX, TROPONINI in the last 168 hours. BNP: BNP (last 3 results) No results for input(s): BNP in the last 8760 hours.  ProBNP (last 3 results) No results for input(s): PROBNP in the last 8760 hours.  CBG:  Recent Labs Lab 11/21/15 1403  GLUCAP 121*    Time coordinating discharge: 35 minutes  Signed:  Claudell Wohler  [facility] 11/22/2015, 3:57 PM

## 2015-11-23 ENCOUNTER — Other Ambulatory Visit: Payer: Self-pay

## 2015-11-23 ENCOUNTER — Telehealth: Payer: Self-pay | Admitting: Family Medicine

## 2015-11-23 NOTE — Patient Outreach (Signed)
[facility]) Care Management  11/23/2015  Evelyn White May 14, 1941 [id number]  Referral per primary care. Member with recent hospitalization 1/29-1/30 for Vertigo,nausea. Member with a history of dementia. Discharged to home with son and home health nurse/socialworker. Per primary care referral to assist with moms needs-possible placement.  Plan: Follow up call tomorrow.  Thea Silversmith, RN, MSN, [place] Coordinator Cell: [phone]

## 2015-11-23 NOTE — Telephone Encounter (Signed)
Called pt's son and attempted to complete TOC call. Pt's son stated that she is better. He was unable to answer any questions concerning his mother's mediations. It was very apparent that he was struggling with his mother's issues. An appt was made for Thursday and 1:30 and he stated he was trying to get his siblings down to go with him and his mom to that [place]. He seem completely out of his element. I was unable to continue with TOC call. I called Dr. Tomi Bamberger and she recommended that I call [facility] and ask for assistance. [facility] opened a referral for him and contact info was given.

## 2015-11-24 ENCOUNTER — Other Ambulatory Visit: Payer: Self-pay

## 2015-11-24 DIAGNOSIS — Z599 Problem related to housing and economic circumstances, unspecified: Secondary | ICD-10-CM

## 2015-11-24 NOTE — Patient Outreach (Signed)
[facility]) Care Management  11/24/2015  GWENETTE CHURCHFIELD [birth year] [id number]  Assessment: Referral received per primary care. RNCM returned call to screen and arrange home visit. No answer. HIPPA compliant message left.  Plan: await return call and follow up within 1-2 days if no return call.  Thea Silversmith, RN, MSN, [place] Coordinator Cell: [phone]

## 2015-11-25 ENCOUNTER — Other Ambulatory Visit: Payer: Self-pay

## 2015-11-25 ENCOUNTER — Ambulatory Visit (INDEPENDENT_AMBULATORY_CARE_PROVIDER_SITE_OTHER): Payer: Medicare Other | Admitting: Family Medicine

## 2015-11-25 ENCOUNTER — Encounter: Payer: Self-pay | Admitting: Family Medicine

## 2015-11-25 VITALS — BP 148/90 | HR 88 | Ht 64.0 in | Wt 131.6 lb

## 2015-11-25 DIAGNOSIS — Z853 Personal history of malignant neoplasm of breast: Secondary | ICD-10-CM

## 2015-11-25 DIAGNOSIS — F321 Major depressive disorder, single episode, moderate: Secondary | ICD-10-CM

## 2015-11-25 DIAGNOSIS — F0390 Unspecified dementia without behavioral disturbance: Secondary | ICD-10-CM | POA: Diagnosis not present

## 2015-11-25 DIAGNOSIS — R4189 Other symptoms and signs involving cognitive functions and awareness: Secondary | ICD-10-CM | POA: Diagnosis not present

## 2015-11-25 DIAGNOSIS — Z599 Problem related to housing and economic circumstances, unspecified: Secondary | ICD-10-CM

## 2015-11-25 DIAGNOSIS — M810 Age-related osteoporosis without current pathological fracture: Secondary | ICD-10-CM

## 2015-11-25 MED ORDER — ESCITALOPRAM OXALATE 10 MG PO TABS: 10.0000 mg | ORAL_TABLET | Freq: Every day | ORAL | Status: DC

## 2015-11-25 MED ORDER — ALENDRONATE SODIUM 70 MG PO TABS: 70.0000 mg | ORAL_TABLET | ORAL | Status: DC

## 2015-11-25 MED ORDER — MEMANTINE HCL 28 X 5 MG & 21 X 10 MG PO TABS: ORAL_TABLET | ORAL | Status: DC

## 2015-11-25 NOTE — Patient Outreach (Signed)
[facility]) Care Management  11/25/2015  Evelyn White 08/11/1941 [id number]  Referral received from primary care. RNCM called member's son x3. Unable to reach.  Plan: RNCM will send outreach letter. RNCM will also place request for social worker to address possible placement needs.  Thea Silversmith, RN, MSN, [place] Coordinator Cell: [phone]

## 2015-11-25 NOTE — Patient Instructions (Signed)
Continue the aricept once daily. Start the Namenda as directed (titration pack will gradually increase the dose to 10mg  twice daily). We will schedule a follow up appointment with the neurologist--she may make additional changes to the medication, and hopefully will be able to provide information about support, placement, etc.  Restart the lexapro--she should have refills left at the pharmacy, if she doesn't please let us know.  I would restart at just 1/2 tablet for the first week, and then go back to full tablet daily. Consider counseling to help with moods, stress, anxiety.  Dr. [name] Fredrickson is a geriatric psychiatrist. Evelyn White has good therapists for counseling [facility], near [place]).  For the cough--be sure to be drinking plenty of water to keep the mucus/phlegm thin. Use robitussin or Mucinex to thin out the mucus, which should help with the cough.

## 2015-11-25 NOTE — Addendum Note (Signed)
Addended by: Luretha Rued on: 11/25/2015 10:12 AM   Modules accepted: Orders

## 2015-11-25 NOTE — Progress Notes (Addendum)
Chief Complaint  Patient presents with  . Hospitalization Follow-up    follow up today.     She was hospitalized 1/29-30 with dizziness/syncope vs seizure (likely syncope).  She had gastroenteritis, and worsening of dementia.  She had not been compliant with her Aricept. It is felt that she needed 24 hour supervision, and SW is working with son to help with placement.  Her 2 sons from [place] should be arriving later today.  She had elevated BP's while in the hospital, possibly related to anxiety in hospital, and was to f/u with PCP for recheck and determine if antihypertensive medication was needed. Her Lexapro was stopped, since she hadn't been taking it. She was continued on Aricept and Arimidex.  They stopped many of her vitamins to try and simplify things as well--Biotin, Calcium, Vit D, Lexapro, iron, fish oil, ginko, glucosamine, naproxen, osteo bio-flex, B12 and Vit E.  Told to continue arimidex, aricept, alendronate, ASA 81mg , omeprazole, MVI. Given zofran prn nausea, lactulose prn constipation. She has NOT been taking prilosec, MVI or aspirin. Not taking lactulose or zofran.  Son reports not having alendronate at home, needs refill.  She has a "vicious cough" at night--small amount of brownish green 1-2 times/day. Doesn't keep her awake. Starts in the evening, around 7pm. No fevers, sinus pain, ear pain, sore throat, headaches, dizziness.  No further vomiting or diarrhea.  Dementia:  Declined significantly over the last 6 months, some of which is due to noncompliance with medications.  She is very frustrated, got tearful during the visit. She wants to be able to "fix things" and she has always done when there is a problem. Denies leaving stove/oven on, and states she won't cook anymore.  Denies getting lost when driving. See MMSE.   Past Medical History  Diagnosis Date  . OA (osteoarthritis)     hands,feet  . Varicose veins   . Left knee pain     h/o-Dr.Collins  . BCC  (basal cell carcinoma)     RLE-Dr.Lomax  . Colon polyps 5/06    hyperplastic  . Anemia 05/2009    iron deficient  . Gastric polyps 06/2009    small(Dr.Mann) EGD and Colonoscopy   . Osteoporosis     managed by Dr. Leo Grosser  . GERD (gastroesophageal reflux disease)   . Breast cancer ([place]) 1995    h/o left breast CA-s/p lumpectomy,radiation and Tamoxifen x 5years  . Breast cancer ([place]) 2016    [place] of right breast   Past Surgical History  Procedure Laterality Date  . Upper gastrointestinal endoscopy  08/18/13    large hiatal hernia  . Tonsillectomy  age 42  . Breast lumpectomy  1995    left breast  . Varicose vein treatment  1990's    LLE  . Cataract extraction, bilateral  12/2013    Dr. Luretha Rued  . Radioactive seed guided excisional breast biopsy Right 03/25/2015    Procedure: RADIOACTIVE SEED GUIDED RIGHT EXCISIONAL BREAST BIOPSY;  Surgeon: Rolm Bookbinder, MD;  Location: [facility];  Service: General;  Laterality: Right;   Social History   Social History  . Marital Status: Widowed    Spouse Name: N/A  . Number of Children: 3  . Years of Education: N/A   Occupational History  . 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 Smoking status: Former Smoker -- 0.50 packs/day for 5 years    Types: Cigarettes    Quit date: 10/23/1981  . Smokeless tobacco:  Never Used  . Alcohol Use: 0.0 oz/week    0 Standard drinks or equivalent per week     Comment: 1.5 glasses of red wine daily  . Drug Use: No  . Sexual Activity: Not on file   Other Topics Concern  . Not on file   Social History Narrative   Widowed (spring 2014), lives with 2 cats, 2 dogs.  Has 3 sons, one with alcoholism (did well in rehab at [facility]), 1 in [place], 1 in [place]. 2 granddaughters   Outpatient Encounter Prescriptions as of 11/25/2015  Medication Sig Note  . alendronate (FOSAMAX) 70 MG tablet Take 1 tablet (70 mg total) by mouth every 7 (seven) days.   Marland Kitchen anastrozole  (ARIMIDEX) 1 MG tablet Take 1 tablet (1 mg total) by mouth daily. 11/21/2015: 12-13  . donepezil (ARICEPT) 10 MG tablet Take 1 tablet daily 11/21/2015: 11-06  . [DISCONTINUED] alendronate (FOSAMAX) 70 MG tablet Take 70 mg by mouth every 7 (seven) days.  03/19/2015: .   Marland Kitchen [DISCONTINUED] alendronate (FOSAMAX) 70 MG tablet Take 1 tablet (70 mg total) by mouth every 7 (seven) days. Take with full glass of water, on empty stomach and do not eat or recline for 30 min after taking   . [DISCONTINUED] alendronate (FOSAMAX) 70 MG tablet Take 1 tablet (70 mg total) by mouth every 7 (seven) days. Take with full glass of water, on empty stomach and do not eat or recline for 30 min after taking   . acetaminophen (TYLENOL) 325 MG tablet Take 325 mg by mouth as needed for mild pain or moderate pain. Reported on 11/25/2015 03/19/2015: .   . aspirin 81 MG tablet Take 81 mg by mouth daily. Reported on 11/25/2015   . escitalopram (LEXAPRO) 10 MG tablet Take 1 tablet (10 mg total) by mouth daily. Restart at 1/2 tablet daily for a week, then increase to full tablet   . lactulose (CHRONULAC) 10 GM/15ML solution Take 15 mLs (10 g total) by mouth 2 (two) times daily as needed for mild constipation. (Patient not taking: Reported on 11/25/2015)   . memantine (NAMENDA TITRATION PAK) tablet pack 5 mg/day for =1 week; 5 mg twice daily for =1 week; 15 mg/day given in 5 mg and 10 mg separated doses for =1 week; then 10 mg twice daily   . Multiple Vitamins-Minerals (MULTIVITAMIN WITH MINERALS) tablet Take 1 tablet by mouth daily. Reported on 11/25/2015   . omeprazole (PRILOSEC) 40 MG capsule Take 40 mg by mouth daily. Reported on 11/25/2015 11/21/2015: 09-14-15  . ondansetron (ZOFRAN ODT) 4 MG disintegrating tablet Take 1 tablet (4 mg total) by mouth every 8 (eight) hours as needed for nausea or vomiting. (Patient not taking: Reported on 11/25/2015)    No facility-administered encounter medications on file as of 11/25/2015.   (Namenda started at  today's visit, not prior)  No Known Allergies   ROS: no fever, chills. +cough see HPI.  No headaches, dizziness, nausea, vomiting, chest pain, shortness of breath. Diarrhea resoled. +memory loss.   PHYSICAL EXAM: BP 148/90 mmHg  Pulse 88  Ht 5\' 4"  (1.626 m)  Wt 131 lb 9.6 oz (59.693 kg)  BMI 22.58 kg/m2  140/80 on repeat by MD Well developed, well-nourished, pleasant female, accompanied by her son Frederico Hamman, in no distress.  At one point she became tearful, sad, frustrated.   HEENT: PERRL, EOMI, conjunctiva clear. OP clear. Sinuses nontender Neck no lymphadenopathy or mass Heart: regular rate and rhythm Lungs: clear bilaterally Neuro: normal  strength, sensation, gait, cranial nerves intact. Alert.  See MMSE Psych: intermittently depressed mood, full range of affect, normal eye contact, speech, hygiene and grooming.  MMSE: "just turned February, 2011". Unaware of day, date (1/5) Location 5/5 Recall 3/3 Later recall 0/3 Read and obey sentence 1/1 Name 2 objects 2/2 Repeat sentence 1/1 3 stage command 3/3 Spelling WORLD backwards--she did this very quickly, DLORW (3/5).   Also did Serial 7's 0/5 (100, 13; second attempt 100, 3). Write sentence 1/1 ("Get me out of here!") Copy design 0/1 (did not intersect the two pentagons).  20/30 (using WORLD backwards; 17/30 using serial 7's. This is significantly worse compared to 05/2015.  Clock drawing--wrote 12 numbers out, but counter clockwise rather than clockwise.  No hands on the clock face.  Animal naming--cats, dogs, bunnies, rabbits, chicken, dogs.  "I can see them, but can't think of the names"  ASSESSMENT/PLAN:  Osteoporosis - refill alendronate; reviewed proper way to take - Plan: alendronate (FOSAMAX) 70 MG tablet, DISCONTINUED: alendronate (FOSAMAX) 70 MG tablet, DISCONTINUED: alendronate (FOSAMAX) 70 MG tablet  Personal history of breast cancer - continue f/u with oncologist, arimidex  Major depressive disorder, single  episode, moderate ([place]) - tearful, helpless. Would like to restart the Lexapro. start at 1/2 tablet x 1 wk, then full tablet. Consider counseling. - Plan: escitalopram (LEXAPRO) 10 MG tablet  Cognitive decline - significant decline over the last 6 months; noncompliant with aricept. Arranging for care to help with meds. Add Namenda to Aricept.  Dementia, without behavioral disturbance - add namenda. take aricept daily.  f/u with neuro - Plan: memantine (NAMENDA TITRATION PAK) tablet pack  S/sx reflux reviewed. Okay to leave off omeprazole for now and restart if symtpoms recur.  Drink plenty of fluids.  Discussed need for 24 hour care, possible dangers. Discussed home health aides vs facilities, different levels of care. She is considering moving to [place] with her other sons, but feels like she would be a burden. Recommended f/u with Dr. Renette Butters further med adjustment; also their office will likely have a lot of resources for patient/family.

## 2015-11-26 ENCOUNTER — Other Ambulatory Visit: Payer: Self-pay | Admitting: Licensed Clinical Social Worker

## 2015-11-26 NOTE — Patient Outreach (Signed)
[facility]) Care Management  11/26/2015  TANIA HAAGEN 14-Feb-1941 [id number]   Assessment-CSW received referral from [facility]. However, RNCM has completed three outreaches without any success to both patient or patient's son contact numbers. CSW completed initial outreach on 11/26/15. No success.   Plan-Will update RNCM. Will completed second outreach on 11/29/15.  [name], BSW, MSW, [email or web address] Phone: [phone] Fax: [phone]

## 2015-11-28 DIAGNOSIS — Z7982 Long term (current) use of aspirin: Secondary | ICD-10-CM | POA: Diagnosis not present

## 2015-11-28 DIAGNOSIS — F0390 Unspecified dementia without behavioral disturbance: Secondary | ICD-10-CM | POA: Diagnosis not present

## 2015-11-28 DIAGNOSIS — Z853 Personal history of malignant neoplasm of breast: Secondary | ICD-10-CM | POA: Diagnosis not present

## 2015-11-28 DIAGNOSIS — F321 Major depressive disorder, single episode, moderate: Secondary | ICD-10-CM | POA: Diagnosis not present

## 2015-11-28 DIAGNOSIS — R41841 Cognitive communication deficit: Secondary | ICD-10-CM | POA: Diagnosis not present

## 2015-11-28 DIAGNOSIS — K219 Gastro-esophageal reflux disease without esophagitis: Secondary | ICD-10-CM | POA: Diagnosis not present

## 2015-11-29 ENCOUNTER — Other Ambulatory Visit: Payer: Self-pay | Admitting: Licensed Clinical Social Worker

## 2015-11-29 NOTE — Patient Outreach (Signed)
[facility]) Care Management  11/29/2015  Evelyn White Jul 03, 1941 [id number]   Assessment- CSW completed second outreach to son on 11/29/15. CSW unable to reach him. CSW and RNCM are both having difficulty establishing contact with patient or family.  Plan-CSW will complete next outreach on 12/01/15.  [name], BSW, MSW, [email or web address] Phone: [phone] Fax: [phone]

## 2015-12-01 ENCOUNTER — Telehealth: Payer: Self-pay | Admitting: Family Medicine

## 2015-12-01 ENCOUNTER — Other Ambulatory Visit: Payer: Self-pay | Admitting: Licensed Clinical Social Worker

## 2015-12-01 DIAGNOSIS — F321 Major depressive disorder, single episode, moderate: Secondary | ICD-10-CM | POA: Diagnosis not present

## 2015-12-01 DIAGNOSIS — R41841 Cognitive communication deficit: Secondary | ICD-10-CM | POA: Diagnosis not present

## 2015-12-01 DIAGNOSIS — F0390 Unspecified dementia without behavioral disturbance: Secondary | ICD-10-CM | POA: Diagnosis not present

## 2015-12-01 DIAGNOSIS — K219 Gastro-esophageal reflux disease without esophagitis: Secondary | ICD-10-CM | POA: Diagnosis not present

## 2015-12-01 DIAGNOSIS — Z853 Personal history of malignant neoplasm of breast: Secondary | ICD-10-CM | POA: Diagnosis not present

## 2015-12-01 DIAGNOSIS — Z7982 Long term (current) use of aspirin: Secondary | ICD-10-CM | POA: Diagnosis not present

## 2015-12-01 NOTE — Telephone Encounter (Signed)
Spoke with Freda Munro and gave verbal order.

## 2015-12-01 NOTE — Patient Outreach (Signed)
[facility]) Care Management  12/01/2015  Evelyn White 06/19/1941 [id number]   Assessment-CSW completed third and final outreach to patient and son's number on 12/01/15. CSW was unable to reach patient or son successfully.   Plan-[facility] RNCM has already sent outreach barrier letter to patient's address. [facility] team will await 10 business days to hear back from patient before completing case closure.  [name], BSW, MSW, [email or web address] Phone: [phone] Fax: [phone]

## 2015-12-01 NOTE — Telephone Encounter (Signed)
Evelyn White with Home health called and states they need a verbal order for speech therapy due to confusion.  Please call Shelia at [phone]

## 2015-12-01 NOTE — Telephone Encounter (Signed)
Ok for order?  

## 2015-12-06 DIAGNOSIS — R41841 Cognitive communication deficit: Secondary | ICD-10-CM | POA: Diagnosis not present

## 2015-12-06 DIAGNOSIS — F0390 Unspecified dementia without behavioral disturbance: Secondary | ICD-10-CM | POA: Diagnosis not present

## 2015-12-06 DIAGNOSIS — Z7982 Long term (current) use of aspirin: Secondary | ICD-10-CM | POA: Diagnosis not present

## 2015-12-06 DIAGNOSIS — Z853 Personal history of malignant neoplasm of breast: Secondary | ICD-10-CM | POA: Diagnosis not present

## 2015-12-06 DIAGNOSIS — K219 Gastro-esophageal reflux disease without esophagitis: Secondary | ICD-10-CM | POA: Diagnosis not present

## 2015-12-06 DIAGNOSIS — F321 Major depressive disorder, single episode, moderate: Secondary | ICD-10-CM | POA: Diagnosis not present

## 2015-12-08 ENCOUNTER — Telehealth: Payer: Self-pay | Admitting: *Deleted

## 2015-12-08 ENCOUNTER — Other Ambulatory Visit: Payer: Self-pay | Admitting: Licensed Clinical Social Worker

## 2015-12-08 ENCOUNTER — Telehealth: Payer: Self-pay | Admitting: Family Medicine

## 2015-12-08 NOTE — Telephone Encounter (Signed)
Spoke with both [name] and B[place]. She will be coming tomorrow, Constance Holster is bringing her. Frederico Hamman would like to get back in touch with [facility]-I called over to [name] and left a message(this is the person that called him)and asked what number he could contact her at as he cannot find the number. Once I get the contact info he asked me to email the info @ [email or web address]

## 2015-12-08 NOTE — Telephone Encounter (Signed)
Vania Rea with [facility] called and said that patient has an order for in home skilled nursing until 12/18/15. Family is really looking for placement in a memory care. Each time she arrives at the home patient is extremely agitated and annoyed with care giver. She spoke with the family and they (I'm guessing Frederico Hamman) said there is no point in continuing the home care order as it is just agitating Pat. Just an FYI.

## 2015-12-08 NOTE — Patient Outreach (Signed)
[facility]) Care Management  12/08/2015  Evelyn White 14-Mar-1941 [id number]   Assessment-CSW received call from Evelyn White stating that patient's son still expresses interest in [facility] program. Both CSW and RNCM have completed six unsuccessful outreaches to son together. CSW completed an additional outreach to patient's son today but was unable to reach him. HIPPA compliant voice message left with CSW contact information.  Plan-CSW will update RNCM. CSW will make an additional outreach on 12/09/15.  [name], BSW, MSW, [email or web address] Phone: [phone] Fax: [phone]

## 2015-12-08 NOTE — Telephone Encounter (Signed)
Noted--please look in her chart.  It looks like [facility] had been trying to reach [place] (phone call from 2/8), and wasn't able to.  I think this was SW, so not sure if they could be helpful to him.  Also, she is on the schedule for 2/16 for AWV--verify they still wanted to keep that (I'm fine with them keeping it, but if they have too much on their plate and would like to cancel, I would understand).  It is too soon to really know how well the lexapro or [place] is working that we started at her last visit, but if having any side effects or tolerability issues, we can definitely address them at the visit.  Constance Holster had contacted me last week with concerns that having someone with her all the time was making her more anxious, upset.  She was asking for a gerontologist (because her mother had gone to one, when older)--I recommended instead seeing Dr. Casimiro Needle (geriatric psych).  I hadn't spoken with Frederico Hamman directly about this, but suspect that Constance Holster has.  (just FYI in case he asks you about this.)

## 2015-12-08 NOTE — Telephone Encounter (Signed)
P.A. NAMENDA

## 2015-12-09 ENCOUNTER — Encounter: Payer: Self-pay | Admitting: Family Medicine

## 2015-12-09 ENCOUNTER — Other Ambulatory Visit: Payer: Self-pay

## 2015-12-09 ENCOUNTER — Ambulatory Visit (INDEPENDENT_AMBULATORY_CARE_PROVIDER_SITE_OTHER): Payer: Medicare Other | Admitting: Family Medicine

## 2015-12-09 VITALS — BP 130/80 | HR 68 | Ht 64.0 in | Wt 132.0 lb

## 2015-12-09 DIAGNOSIS — C50911 Malignant neoplasm of unspecified site of right female breast: Secondary | ICD-10-CM

## 2015-12-09 DIAGNOSIS — R4189 Other symptoms and signs involving cognitive functions and awareness: Secondary | ICD-10-CM

## 2015-12-09 DIAGNOSIS — K219 Gastro-esophageal reflux disease without esophagitis: Secondary | ICD-10-CM | POA: Diagnosis not present

## 2015-12-09 DIAGNOSIS — M810 Age-related osteoporosis without current pathological fracture: Secondary | ICD-10-CM | POA: Diagnosis not present

## 2015-12-09 DIAGNOSIS — F321 Major depressive disorder, single episode, moderate: Secondary | ICD-10-CM | POA: Diagnosis not present

## 2015-12-09 DIAGNOSIS — Z0000 Encounter for general adult medical examination without abnormal findings: Secondary | ICD-10-CM

## 2015-12-09 NOTE — Telephone Encounter (Signed)
[employer] called with other questions regarding P.A. And states Namenda XR is the preferred alternative.  Will have response from P.A. within 24 hours

## 2015-12-09 NOTE — Progress Notes (Signed)
Chief Complaint  Patient presents with  . Medicare Wellness    nonfasting annual exam-no pap, sees Dr.Haygood. Could not provide urine specimen. No concerns. Follow up dementia.    Evelyn White is a 75 y.o. female who presents for complete physical, annual wellness visit and follow-up on chronic medical conditions.  She is accompanied by her good friend Constance Holster (who was added to her HIPPA form today).  Constance Holster feels that she gets more anxious when with her son (who brought her last time), and seems much more oriented and less confused when she is with her.  She wanted me to be able to see the difference.  She reports that she wrote the date correctly on all of the paperwork (yet when I directly asked her the date, she was confused, anxious, because I was there, see below).  They are asking about driving.  She hasn't driven since being in the hospital as they told her not to.  Dementia:  Namenda was added at her recent visit, in addition to aricept, at recent visit. Prior auth just went through, and medication was picked up yesterday--hasn't started it yet.  Depression/anxiety:  Restarted Lexapro (she cannot be absolutely sure, but recalls seeing 1/2 tablet in her pill box) at recent visit. She is here with her friend Constance Holster today, not Frederico Hamman, so unknown if she has actually started it yet.  Osteoporosis:  Refilled alendronate at her recent visit.  She hasn't taken it yet.  Constance Holster advises me that she takes Prolia now.  Chart reviewed, and I see where the alendronate was stopped, as she had gotten Prolia. There was an error in how it was prescribed by the GYN, and they picked up the syringe from the pharmacy. Constance Holster ended up giving her the shot.  She was due sometime in the last month or now, missed an appointment with Dr. Leo Grosser. Plans to reschedule.  Immunization History  Administered Date(s) Administered  . Influenza Split 07/18/2012, 07/11/2014, 07/14/2015  . Influenza, High Dose Seasonal PF  07/04/2013  . Influenza-Unspecified 06/23/2014  . Pneumococcal Conjugate-13 04/23/2014  . Pneumococcal Polysaccharide-23 01/22/2008  . Td 07/20/2004  . Tdap 04/26/2012  . Zoster 06/02/2009   Last Pap smear: Through Dr. Leo Grosser; missed an appointment recently, plans to reschedule Last mammogram: 02/2015 Last colonoscopy: 06/2009, Dr. Collene Mares Last DEXA: per Dr. Leo Grosser. On Prolia injections through Dr. Leo Grosser. Due for another one now. Dentist: twice yearly Ophtho: previously yearly, unsure of her last visit. Exercise: tennis 5x/week, limited in the last few weeks (due to knee pain, illness); personal trainer 2x/week in the past--hasn't seen her lately due to her being unable to drive). Trainer Marcene Brawn) recently started coming to her house, somewhat social, but likely will do some exercises with her.  Other doctors caring for patient include: Neuro: Dr. Delice Lesch GYN: Dr. Leo Grosser GI: Dr. Collene Mares Oncologist: Dr. Jana Hakim Ophtho: Dr. Luretha Rued Dentist: Dr. Essie Hart and Junita Push, next appt 2/21 (getting implant). Ortho: at [facility] for knee (can't recall name) Derm: Dr. Ubaldo Glassing (not for many years)  Depression screen:  negative Fall screen:  Fall only related to syncope with recent illness Function Status: notable for memory loss. No longer driving, so son helps with errands, managing her medications  End of Life Discussion:  Patient has a living will and medical power of attorney These may not be updated (she can't recall if she updated them after her husband passed away; we discussed this last year)--new paperwork given.  Past Medical History  Diagnosis  Date  . OA (osteoarthritis)     hands,feet  . Varicose veins   . Left knee pain     h/o-Dr.Collins in past, not recently  . BCC (basal cell carcinoma)     RLE-Dr.Lomax  . Colon polyps 5/06    hyperplastic  . Anemia 05/2009    iron deficient  . Gastric polyps 06/2009    small(Dr.Mann) EGD and Colonoscopy   . Osteoporosis     managed by Dr.  Leo Grosser  . GERD (gastroesophageal reflux disease)   . Breast cancer ([place]) 1995    h/o left breast CA-s/p lumpectomy,radiation and Tamoxifen x 5years  . Breast cancer ([place]) 2016    [place] of right breast    Past Surgical History  Procedure Laterality Date  . Upper gastrointestinal endoscopy  08/18/13    large hiatal hernia  . Tonsillectomy  age 34  . Breast lumpectomy  1995    left breast  . Varicose vein treatment  1990's    LLE  . Cataract extraction, bilateral  12/2013    Dr. Luretha Rued  . Radioactive seed guided excisional breast biopsy Right 03/25/2015    Procedure: RADIOACTIVE SEED GUIDED RIGHT EXCISIONAL BREAST BIOPSY;  Surgeon: Rolm Bookbinder, MD;  Location: [facility];  Service: General;  Laterality: Right;    Social History   Social History  . Marital Status: Widowed    Spouse Name: N/A  . Number of Children: 3  . Years of Education: N/A   Occupational History  . 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 Smoking status: Former Smoker -- 0.50 packs/day for 5 years    Types: Cigarettes    Quit date: 10/23/1981  . Smokeless tobacco: Never Used  . Alcohol Use: 0.0 oz/week    0 Standard drinks or equivalent per week     Comment: 1.5 glasses of red wine daily  . Drug Use: No  . Sexual Activity: Not on file   Other Topics Concern  . Not on file   Social History Narrative   Widowed (spring 2014), lives with 2 cats, 1 dog.  Had to put a dog down 11/2015 (17 yo). Has 3 sons, one with alcoholism (did well in rehab at [facility]), 1 in [place], 1 in [place]. 2 granddaughters.   11/2015--stopped driving after hospitalization, currently has caregivers, never alone    Family History  Problem Relation Age of Onset  . Hypertension Mother   . Osteoporosis Mother   . Alcohol abuse Son   . Heart disease Maternal Uncle   . Cancer Paternal Grandmother 77    breast cancer  . Diabetes Neg Hx   . Breast cancer Paternal Aunt     dx. age  unknown; lim info    Outpatient Encounter Prescriptions as of 12/09/2015  Medication Sig Note  . anastrozole (ARIMIDEX) 1 MG tablet Take 1 tablet (1 mg total) by mouth daily. 11/21/2015: 12-13  . aspirin 81 MG tablet Take 81 mg by mouth daily. Reported on 11/25/2015   . denosumab (PROLIA) 60 MG/ML SOLN injection Inject 60 mg into the skin every 6 (six) months. Administer in upper arm, thigh, or abdomen 12/09/2015: Per Dr. Leo Grosser, last given July/August?  . donepezil (ARICEPT) 10 MG tablet Take 1 tablet daily 11/21/2015: 11-06  . escitalopram (LEXAPRO) 10 MG tablet Take 1 tablet (10 mg total) by mouth daily. Restart at 1/2 tablet daily for a week, then increase to full tablet   . Multiple Vitamins-Minerals (  MULTIVITAMIN WITH MINERALS) tablet Take 1 tablet by mouth daily. Reported on 11/25/2015   . [DISCONTINUED] alendronate (FOSAMAX) 70 MG tablet Take 1 tablet (70 mg total) by mouth every 7 (seven) days.   . [DISCONTINUED] omeprazole (PRILOSEC) 40 MG capsule Take 40 mg by mouth daily. Reported on 11/25/2015 11/21/2015: 09-14-15  . acetaminophen (TYLENOL) 325 MG tablet Take 325 mg by mouth as needed for mild pain or moderate pain. Reported on 12/09/2015 03/19/2015: .   . memantine (NAMENDA TITRATION PAK) tablet pack 5 mg/day for =1 week; 5 mg twice daily for =1 week; 15 mg/day given in 5 mg and 10 mg separated doses for =1 week; then 10 mg twice daily (Patient not taking: Reported on 12/09/2015)   . [DISCONTINUED] lactulose (CHRONULAC) 10 GM/15ML solution Take 15 mLs (10 g total) by mouth 2 (two) times daily as needed for mild constipation. (Patient not taking: Reported on 11/25/2015)   . [DISCONTINUED] ondansetron (ZOFRAN ODT) 4 MG disintegrating tablet Take 1 tablet (4 mg total) by mouth every 8 (eight) hours as needed for nausea or vomiting. (Patient not taking: Reported on 11/25/2015)    No facility-administered encounter medications on file as of 12/09/2015.    No Known Allergies  ROS:  The patient denies  anorexia, fever, weight changes, headaches,  vision changes, decreased hearing, ear pain, sore throat, breast concerns, chest pain, palpitations, dizziness, syncope, dyspnea on exertion, cough, swelling, nausea, vomiting, diarrhea, constipation, abdominal pain, melena, hematochezia, indigestion/heartburn, hematuria, incontinence, dysuria, vaginal bleeding, discharge, odor or itch, genital lesions, numbness, tingling, weakness, tremor, suspicious skin lesions; currently denies depression or anxiety (in good spirits with her friend here, memory not good enough to recall when she feels sad, such as crying at her last visit); denies abnormal bleeding/bruising, or enlarged lymph nodes. +Throat-clearing--unsure if related to some PND vs recurrent reflux since stopping PPI. Denies heartburn, shortness of breath, chest pain. Sleeping well   PHYSICAL EXAM:  BP 130/80 mmHg  Pulse 68  Ht 5\' 4"  (1.626 m)  Wt 132 lb (59.875 kg)  BMI 22.65 kg/m2  General Appearance:  Alert, cooperative, no distress, appears stated age. She is pleasant, in good spirits, conversing with her friend, Constance Holster   Head:  Normocephalic, without obvious abnormality, atraumatic   Eyes:  PERRL, conjunctiva/corneas clear, EOM's intact, fundi  benign   Ears:  Normal TM's and external ear canals   Nose:  Nares normal, mucosa normal, no drainage or sinus tenderness   Throat:  Lips, mucosa, and tongue normal; teeth and gums normal   Neck:  Supple, no lymphadenopathy; thyroid: no enlargement/tenderness/nodules; no carotid  bruit or JVD   Back:  Spine nontender, no curvature, ROM normal, no CVA tenderness  Lungs:  Clear to auscultation bilaterally without wheezes, rales or ronchi; respirations unlabored   Chest Wall:  No tenderness or deformity   Heart:  Regular rate and rhythm, S1 and S2 normal, no murmur, rub  or gallop   Breast Exam:  Deferred to GYN   Abdomen:  Soft, non-tender, nondistended, normoactive  bowel sounds,  no masses, no hepatosplenomegaly   Genitalia:  Deferred to GYN      Extremities:  No clubbing, cyanosis or edema   Pulses:  2+ and symmetric all extremities   Skin:  Skin color, texture, turgor normal, no rashes or lesions.  Lymph nodes:  Cervical, supraclavicular, and axillary nodes normal   Neurologic:  CNII-XII intact, normal strength, sensation and gait; reflexes 2+ and symmetric throughout.  Very brief mental status testing  done (full MMSE done at recent visit). See below.   Psych: Normal mood, affect, hygiene and grooming.         Current date: 01/01/2014, Thursday (correct) Serial 7's: 100, 93, 14 Immediate recall normal, 0 recall after a some of the other test questions. DLROW (she did this very quickly, and she and Constance Holster report that she has a Theatre stage manager of spelling things backwards) Clock drawn this time in correct direction (wrote them counter-clockwise last time), no hands   ASSESSMENT/PLAN:  Annual physical exam  Osteoporosis - call GYN to schedule Prolia injection (and GYN exam). 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 Start Namenda and f/u w/neuro  Encounter for [employer] annual wellness exam  Gastroesophageal reflux disease, esophagitis presence not specified - prev tx'd with rx PPI. Currently not taking anything, no heartburn, +throat-clearing may be reflux. Re-start OTC trial    Discussed monthly self breast exams and yearly  mammograms; at least 30 minutes of aerobic activity at least 5 days/week and weight-bearing exercise 2x/week; proper sunscreen use reviewed; healthy diet, including goals of calcium and vitamin D intake and alcohol recommendations (less than or equal to 1 drink/day) reviewed; regular seatbelt use; changing batteries in smoke detectors.  Immunization recommendations discussed, UTD.  Colonoscopy recommendations reviewed, UTD  Recent hospital labs reviewed. Pt nonfasting today. No labs done/needed nonfasting.  Patient was given new forms for Living Will, Healthcare power of attorney, and asked to give [place] copies of updated info.  All meds reviewed in detail, and detail info on AVS to be clear for her son to know which meds to be giving her.   Medicare Attestation I have personally reviewed: The patient's medical and social history Their use of alcohol, tobacco or illicit drugs Their current medications and supplements The patient's functional ability including ADLs,fall risks, home safety risks, cognitive, and hearing and visual impairment Diet and physical activities Evidence for depression or mood disorders  The patient's weight, height, BMI, and visual acuity have been recorded in the chart.  I have made referrals, counseling, and provided education to the patient based on review of the above and I have provided the patient with a written personalized care plan for preventive services.     Asa Baudoin A, MD   12/09/2015

## 2015-12-09 NOTE — Patient Outreach (Signed)
[facility]) Care Management  12/09/2015  Evelyn White [birth year] [id number]  RNCM received message from [name], [place] that she was informed by member provider that member's son wanted to speak with [facility] management. Member/son was previously unsuccessful in outreach and [employer] was mailed on 11/24/14. RNCM called today. No answer. HIPPA compliant message left.  Plan: await return phone call. update [place] social worker. Close case if unable to reach.  Thea Silversmith, RN, MSN, [place] Coordinator Cell: [phone]

## 2015-12-09 NOTE — Patient Instructions (Addendum)
HEALTH MAINTENANCE RECOMMENDATIONS:  It is recommended that you get at least 30 minutes of aerobic exercise at least 5 days/week (for weight loss, you may need as much as 60-90 minutes). This can be any activity that gets your heart rate up. This can be divided in 10-15 minute intervals if needed, but try and build up your endurance at least once a week.  Weight bearing exercise is also recommended twice weekly.  Eat a healthy diet with lots of vegetables, fruits and fiber.  "Colorful" foods have a lot of vitamins (ie green vegetables, tomatoes, red peppers, etc).  Limit sweet tea, regular sodas and alcoholic beverages, all of which has a lot of calories and sugar.  Up to 1 alcoholic drink daily may be beneficial for women (unless trying to lose weight, watch sugars).  Drink a lot of water.  Calcium recommendations are 1200-1500 mg daily (1500 mg for postmenopausal women or women without ovaries), and vitamin D 1000 IU daily.  This should be obtained from diet and/or supplements (vitamins), and calcium should not be taken all at once, but in divided doses.  Monthly self breast exams and yearly mammograms for women over the age of 14 is recommended.  Sunscreen of at least SPF 30 should be used on all sun-exposed parts of the skin when outside between the hours of 10 am and 4 pm (not just when at beach or pool, but even with exercise, golf, tennis, and yard work!)  Use a sunscreen that says "broad spectrum" so it covers both UVA and UVB rays, and make sure to reapply every 1-2 hours.  Remember to change the batteries in your smoke detectors when changing your clock times in the spring and fall.  Use your seat belt every time you are in a car, and please drive safely and not be distracted with cell phones and texting while driving.   Evelyn White , Thank you for taking time to come for your Medicare Wellness Visit. I appreciate your ongoing commitment to your health goals. Please review the following  plan we discussed and let me know if I can assist you in the future.   These are the goals we discussed: Goals    None      This is a list of the screening recommended for you and due dates:  Health Maintenance  Topic Date Due  . DEXA scan (bone density measurement)  09/20/2006  . Flu Shot  05/23/2016  . Mammogram  02/23/2017  . Colon Cancer Screening  06/24/2019  . Tetanus Vaccine  04/26/2022  . Shingles Vaccine  Completed  . Pneumonia vaccines  Completed    Your bone density tests are done through Dr. Reginia Naas with her to see when due again. Mammogram date above it wrong--due again 02/2016   Question as to prilosec 40mg /omeprazole--I haven't prescribed this. Unsure if it came from GI, Dr. Collene Mares. Okay to NOT be taking this right now.  If you develop heartburn, belching, or chronic cough, restart the Prilosec OTC and if that helps, she should continue to take that daily.  If not helpful, we may need to resume the prescription strength. It is unclear to me whether she has been taking this, so we can try without it.  We should STOP the alendronate (I believe I wrote for this at last visit).  She is due for next Prolia injection, and she shouldn't be doing both--continue with every 6 month Prolia injections from Dr. Caralee Ates office, and NOT the alendronate (fosamax).  Start the [place] as directed. Use the 81mg  of coated aspirin daily (and double check at upcoming neurology visit that this is still recommended). Take the multivitamin daily. Use tylenol only if needed for pain. Okay to use ibuprofen (along with food) periodically for knee pain, especially if there is any swelling or inflammation. If taking ibuprofen on a daily basis, she should be taking Prilosec OTC every day, to prevent stomach ulcers.

## 2015-12-10 ENCOUNTER — Other Ambulatory Visit: Payer: Self-pay | Admitting: Licensed Clinical Social Worker

## 2015-12-10 ENCOUNTER — Encounter: Payer: Self-pay | Admitting: Family Medicine

## 2015-12-10 NOTE — Telephone Encounter (Signed)
Pharmacy/pt informed.

## 2015-12-10 NOTE — Telephone Encounter (Signed)
P.A. Isabelle Course, pt needs trial and failure of Namenda XR, Namenda XR titration pack, memantine or menantine titration pack.   Do you want to switch?

## 2015-12-10 NOTE — Telephone Encounter (Signed)
I was told by patient (whose history is NOT accurate) that they had gotten a call from the pharmacy the day before she was just seen this week, stating Josem Kaufmann was approved, that they picked it up, but hadn't started it yet.  Verify which is the case. I wrote for memantine (namenda) titration pack (see med list0, just not the XR-- you have this med listed as something she needs to try, so I don't really understand what the problem is.

## 2015-12-10 NOTE — Telephone Encounter (Signed)
I didn't write "dispense as written", so I'm not sure why they didn't automatically substitute... I assumed the generic would be covered and less expensive than the branded XR, and thought she should try that first.  Please have them fill the generic starter pack, and advise the patient and her son Frederico Hamman when ready and they need to start it.

## 2015-12-10 NOTE — Telephone Encounter (Signed)
I called the pharmacy and pt never picked up [place] and it is not going thru insurance, requires P.A. Insurance company will not cover the plain (brand) Namanda, only the XR.   I called pharmacy back and ins does cover the plain generic memantine titration pack for $20 co pay. Is this OK?

## 2015-12-10 NOTE — Patient Outreach (Signed)
[facility]) Care Management  12/10/2015  Evelyn White 1940-12-05 [id number]   Assessment-CSW completed an additional outreach to patient's son on 12/10/15. CSW unsuccessful in reaching son and left an additional HIPPA compliant voice message providing contact information. This is the 8th outreach unsuccessful outreach attempt by [facility].   Plan-CSW will update RNCM. Will await return call. If not return call has been completed, [facility] will complete case closure.  [name], BSW, MSW, [email or web address] Phone: [phone] Fax: [phone]

## 2015-12-14 DIAGNOSIS — Z79899 Other long term (current) drug therapy: Secondary | ICD-10-CM | POA: Diagnosis not present

## 2015-12-15 DIAGNOSIS — F0390 Unspecified dementia without behavioral disturbance: Secondary | ICD-10-CM | POA: Diagnosis not present

## 2015-12-15 DIAGNOSIS — F321 Major depressive disorder, single episode, moderate: Secondary | ICD-10-CM | POA: Diagnosis not present

## 2015-12-15 DIAGNOSIS — R41841 Cognitive communication deficit: Secondary | ICD-10-CM | POA: Diagnosis not present

## 2015-12-15 DIAGNOSIS — Z853 Personal history of malignant neoplasm of breast: Secondary | ICD-10-CM | POA: Diagnosis not present

## 2015-12-15 DIAGNOSIS — K219 Gastro-esophageal reflux disease without esophagitis: Secondary | ICD-10-CM | POA: Diagnosis not present

## 2015-12-15 DIAGNOSIS — Z7982 Long term (current) use of aspirin: Secondary | ICD-10-CM | POA: Diagnosis not present

## 2015-12-21 ENCOUNTER — Encounter: Payer: Self-pay | Admitting: Neurology

## 2015-12-21 ENCOUNTER — Ambulatory Visit (INDEPENDENT_AMBULATORY_CARE_PROVIDER_SITE_OTHER): Payer: Medicare Other | Admitting: Neurology

## 2015-12-21 VITALS — BP 112/74 | HR 76 | Ht 64.0 in | Wt 133.0 lb

## 2015-12-21 DIAGNOSIS — R55 Syncope and collapse: Secondary | ICD-10-CM

## 2015-12-21 DIAGNOSIS — G3184 Mild cognitive impairment, so stated: Secondary | ICD-10-CM | POA: Diagnosis not present

## 2015-12-21 NOTE — Patient Instructions (Signed)
1. Continue Aricept 10mg  daily and Namenda 10mg  twice a day 2. Physical exercise and brain stimulation exercises are important for brain health 3. As per [place] driving laws, no driving after an episode of confusion or loss of awareness, until 6 months event-free 4. Once you return to driving, continue to monitor driving and if concerns arise, recommend a DMV driving evaluation 5. Follow-up in 1 year, call for any changes

## 2015-12-21 NOTE — Progress Notes (Signed)
NEUROLOGY FOLLOW UP OFFICE NOTE  Evelyn White [id number]  HISTORY OF PRESENT ILLNESS: I had the pleasure of seeing Evelyn White in follow-up in the neurology clinic on 12/21/2015. She is accompanied by her friend/neighbor Evelyn White who helps supplement the history today. She was last seen in June 2015 for memory loss. MOCA score in February 2015 was 23/30, indicating mild cognitive impairment. She had been started on Aricept at that time. Records and images were reviewed. She was admitted to [facility] on 11/21/15 for nausea. Her son found her sitting on the floor in front of the fridge in vomit, stool, and urine, confused. It appears she had brief loss of consciousness with no convulsive activity. Bloodwork showed a WBC of 13.8, potassium 3.2, glucose 150, LFTs normal, urinalysis >80 ketones, B12 2599, TSH 0.395, ammonia 40. I personally reviewed head CT without contrast which showed moderate diffuse atrophy and chronic microvascular disease, no acute changes. It was felt she had fainting spells secondary to gastroenteritis and dehydration. PT/PFT recommended 24-hour supervision. There was note that the son reported staring spells.   The patient does not recall much of the hospital stay, the last thing she recalls was being in the car with her son, then waking up in the hospital. She reports her memory is "terrible," she has been told she would ask the same question or say the same thing 10 minutes later. She lives alone. There is note that she was initially poorly compliant to Aricept, but now Evelyn White fills her pillbox and ensures she takes her medications regularly. She had seen her PCP at the beginning of the month, MMSE at that time was 20/30 (using WORLD) and 17/30 (using serial 7s). Namenda was started, she is taking 10mg  BID with no side effects. She denies any missed bill payments, but a lot of bills are now on autopay. She had been driving prior to hospital stay, and denied getting lost. Evelyn White denies  any staring/unresponsive episodes. The patient denies any gaps in time, olfactory/gustatory hallucinations, focal numbness/tingling/weakness, myoclonic jerks. No headaches, dizziness. She has occasional diarrhea.    HPI 11/2013:  This is a very pleasant 75 yo RH woman with a history of breast cancer status post lumpectomy and radiation in 1995, presenting for evaluation of worsening memory problems over the past year. She reports that family members had noticed it before she did. Her son reports that he and his father, who passed away in Spring 2014, had started noticing it before his death, however memory problems seemed to worsen since then. She initially started having difficulty remembering names, now she forgets a word or where she is in a sentence. She misplaces things at home, for instance, she could not find a bill or checks at home. Family has noticed that she would repeat questions several times. In the office today, her son pointed out that she asked 3 times if she needed to put her shoes back on after the exam. Her son cites a specific instance last Christmas where the whole family baked cookies, then a few minutes later she asked who made the cookies. She does not recall this incident. Her son has noticed that she has some difficulties with problem solving. She continues to drive without getting lost. She is very active, playing tennis 5x a week, and seeing a personal trainer twice a week. She does crossword puzzles and reads books.   Diagnostic Data: I personally reviewed her MRI brain without contrast done 12/06/13, which showed moderate cerebral and cerebellar  atrophy, particularly over the bilateral temporal lobes. Mild to moderate subcortical and periventricular T2 and FLAIR hyperintensities, no acute changes.  TSH and B12 normal.  PAST MEDICAL HISTORY: Past Medical History  Diagnosis Date  . OA (osteoarthritis)     hands,feet  . Varicose veins   . Left knee pain     h/o-Dr.Collins in  past, not recently  . BCC (basal cell carcinoma)     RLE-Dr.Lomax  . Colon polyps 5/06    hyperplastic  . Anemia 05/2009    iron deficient  . Gastric polyps 06/2009    small(Dr.Mann) EGD and Colonoscopy   . Osteoporosis     managed by Dr. Leo Grosser  . GERD (gastroesophageal reflux disease)   . Breast cancer ([place]) 1995    h/o left breast CA-s/p lumpectomy,radiation and Tamoxifen x 5years  . Breast cancer ([place]) 2016    ILC of right breast    MEDICATIONS: Current Outpatient Prescriptions on File Prior to Visit  Medication Sig Dispense Refill  . acetaminophen (TYLENOL) 325 MG tablet Take 325 mg by mouth as needed for mild pain or moderate pain. Reported on 12/09/2015    . anastrozole (ARIMIDEX) 1 MG tablet Take 1 tablet (1 mg total) by mouth daily. 90 tablet 4  . aspirin 81 MG tablet Take 81 mg by mouth daily. Reported on 11/25/2015    . denosumab (PROLIA) 60 MG/ML SOLN injection Inject 60 mg into the skin every 6 (six) months. Administer in upper arm, thigh, or abdomen    . donepezil (ARICEPT) 10 MG tablet Take 1 tablet daily 30 tablet 11  . escitalopram (LEXAPRO) 10 MG tablet Take 1 tablet (10 mg total) by mouth daily. Restart at 1/2 tablet daily for a week, then increase to full tablet    . memantine (NAMENDA TITRATION PAK) tablet pack 5 mg/day for =1 week; 5 mg twice daily for =1 week; 15 mg/day given in 5 mg and 10 mg separated doses for =1 week; then 10 mg twice daily 49 tablet 0  . Multiple Vitamins-Minerals (MULTIVITAMIN WITH MINERALS) tablet Take 1 tablet by mouth daily. Reported on 11/25/2015     No current facility-administered medications on file prior to visit.    ALLERGIES: No Known Allergies  FAMILY HISTORY: Family History  Problem Relation Age of Onset  . Hypertension Mother   . Osteoporosis Mother   . Alcohol abuse Son   . Heart disease Maternal Uncle   . Cancer Paternal Grandmother 82    breast cancer  . Diabetes Neg Hx   . Breast cancer Paternal Aunt     dx. age  unknown; lim info    SOCIAL HISTORY: Social History   Social History  . Marital Status: Widowed    Spouse Name: N/A  . Number of Children: 3  . Years of Education: N/A   Occupational History  . 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 Smoking status: Former Smoker -- 0.50 packs/day for 5 years    Types: Cigarettes    Quit date: 10/23/1981  . Smokeless tobacco: Never Used  . Alcohol Use: 0.0 oz/week    0 Standard drinks or equivalent per week     Comment: 1.5 glasses of red wine daily  . Drug Use: No  . Sexual Activity: Not on file   Other Topics Concern  . Not on file   Social History Narrative   Widowed (spring 2014), lives with 2 cats, 1 dog.  Had to  put a dog down 11/2015 (17 yo). Has 3 sons, one with alcoholism (did well in rehab at [facility]), 1 in [place], 1 in [place]. 2 granddaughters.   11/2015--stopped driving after hospitalization, currently has caregivers, never alone    REVIEW OF SYSTEMS: Constitutional: No fevers, chills, or sweats, no generalized fatigue, change in appetite Eyes: No visual changes, double vision, eye pain Ear, nose and throat: No hearing loss, ear pain, nasal congestion, sore throat Cardiovascular: No chest pain, palpitations Respiratory:  No shortness of breath at rest or with exertion, wheezes GastrointestinaI: No nausea, vomiting, diarrhea, abdominal pain, fecal incontinence Genitourinary:  No dysuria, urinary retention or frequency Musculoskeletal:  No neck pain, back pain Integumentary: No rash, pruritus, skin lesions Neurological: as above Psychiatric: No depression, insomnia, anxiety Endocrine: No palpitations, fatigue, diaphoresis, mood swings, change in appetite, change in weight, increased thirst Hematologic/Lymphatic:  No anemia, purpura, petechiae. Allergic/Immunologic: no itchy/runny eyes, nasal congestion, recent allergic reactions, rashes  PHYSICAL EXAM: Filed Vitals:   12/21/15  1502  BP: 112/74  Pulse: 76   General: No acute distress Head:  Normocephalic/atraumatic Neck: supple, no paraspinal tenderness, full range of motion Heart:  Regular rate and rhythm Lungs:  Clear to auscultation bilaterally Back: No paraspinal tenderness Skin/Extremities: No rash, no edema. Bruises over both arms Neurological Exam: alert and oriented to person, place, and time. No aphasia or dysarthria. Fund of knowledge is appropriate.  Recent and remote memory are intact.  Attention and concentration are normal.    Able to name objects and repeat phrases.  Montreal Cognitive Assessment  12/21/2015  Visuospatial/ Executive (0/5) 4  Naming (0/3) 3  Attention: Read list of digits (0/2) 2  Attention: Read list of letters (0/1) 1  Attention: Serial 7 subtraction starting at 100 (0/3) 3  Language: Repeat phrase (0/2) 2  Language : Fluency (0/1) 1  Abstraction (0/2) 2  Delayed Recall (0/5) 0  Orientation (0/6) 5  Total 23  Adjusted Score (based on education) 23   Cranial nerves: Pupils equal, round, reactive to light. Extraocular movements intact with no nystagmus. Visual fields full. Facial sensation intact. No facial asymmetry. Tongue, uvula, palate midline.  Motor: Bulk and tone normal, muscle strength 5/5 throughout with no pronator drift.  Sensation to light touch intact.  No extinction to double simultaneous stimulation.  Deep tendon reflexes 2+ throughout, toes downgoing.  Finger to nose testing intact.  Gait narrow-based and steady, able to tandem walk adequately.  Romberg negative.  IMPRESSION: This is a very pleasant 75 yo RH woman with worsening memory loss. She was admitted last month for syncope in the setting of gastroenteritis and dehydration, with concern for worsening dementia. Follow-up visits with her PCP showed an MMSE of 17/30, 20/30, however it appears she continues to improve, MOCA score today 23/30 (similar to [facility] score in February 2015, 23/30), indicating mild  cognitive impairment, possible mild dementia. She is now on Aricept 10mg  and Namenda 10mg  BID, continue current medications. She lives alone and her neighbor monitors her closely. They feel that her son is very anxious, which makes the patient more anxious. She is very relaxed in the office today, in good spirits. We discussed driving, and [place] driving laws that indicate one should not drive after an episode of loss of consciousness/awareness until 6 months event-free. At that point, Evelyn White will monitor driving, and if concerns arise, recommend a driving evaluation. We discussed the importance of physical exercise and brain stimulation exercises for brain health. She will follow-up in  1 year and knows to call for any changes.   Thank you for allowing me to participate in her care.  Please do not hesitate to call for any questions or concerns.  The duration of this appointment visit was 25 minutes of face-to-face time with the patient.  Greater than 50% of this time was spent in counseling, explanation of diagnosis, planning of further management, and coordination of care.   Ellouise Newer, M.D.   CC: Dr. Rita Ohara

## 2015-12-22 ENCOUNTER — Telehealth: Payer: Self-pay | Admitting: Family Medicine

## 2015-12-22 DIAGNOSIS — K219 Gastro-esophageal reflux disease without esophagitis: Secondary | ICD-10-CM | POA: Diagnosis not present

## 2015-12-22 DIAGNOSIS — F0390 Unspecified dementia without behavioral disturbance: Secondary | ICD-10-CM | POA: Diagnosis not present

## 2015-12-22 DIAGNOSIS — F321 Major depressive disorder, single episode, moderate: Secondary | ICD-10-CM | POA: Diagnosis not present

## 2015-12-22 DIAGNOSIS — R41841 Cognitive communication deficit: Secondary | ICD-10-CM | POA: Diagnosis not present

## 2015-12-22 DIAGNOSIS — Z7982 Long term (current) use of aspirin: Secondary | ICD-10-CM | POA: Diagnosis not present

## 2015-12-22 DIAGNOSIS — Z853 Personal history of malignant neoplasm of breast: Secondary | ICD-10-CM | POA: Diagnosis not present

## 2015-12-22 NOTE — Telephone Encounter (Signed)
Please let him know that I cannot call him this evening, but can give him a call tomorrow. Also, I know that his mother had an appointment with the neurologist this week, and that Madison went.  I'm not sure if his questions are directed at me, or if he has spoken with the neurologist who did the most recent assessments.  Please be sure to give him Dr. Amparo Bristol number in case the questions are to be directed to her (and then let me know that I don't need to call him, that he can call us back if she didn't answer his questions).

## 2015-12-22 NOTE — Telephone Encounter (Signed)
Son Najee Beardmore t# [id number] called & has many concerns about his mother.  He is on her HIPPA.  I tried to answer his questions but he has lots of mental assessment questions and I told him I would have to refer those to you.  He would like you to call him regarding his concerns about his mother.

## 2015-12-23 ENCOUNTER — Telehealth: Payer: Self-pay | Admitting: *Deleted

## 2015-12-23 NOTE — Telephone Encounter (Signed)
I did let son know about the Neurologist appt and gave him the info on that office yesterday but he still wanted to talk with you regarding her physical and he had questions for you.  Do you still want me to call him today?

## 2015-12-23 NOTE — Telephone Encounter (Signed)
Spoke with son and he states he called the Neurologist and they wouldn't give him any information because he is not on her HIPPA there.  He is her POA & is going to fax POA forms here and also there.  I advised him that you were going to call him

## 2015-12-23 NOTE — Telephone Encounter (Signed)
Called Tim and apologized that his call was not returned yesterday evening. I told him that you would call him this evening. He said that this evening is not a good time and he apologized. He asked if you would be able to call him tomorrow morning? He is in [place], he is asking for you to call him @ 11:00am our time, so 9:00am his time.  His number for easy reference is [phone]. If this time does not work for you I told him I would call him back with a better time for you.

## 2015-12-23 NOTE — Telephone Encounter (Signed)
That should work.  I put a reminder in my phone to call at 56.

## 2015-12-24 NOTE — Telephone Encounter (Signed)
HIPPA forms in chart reviewed.  Most recent PFM ones have [place] and [place].  Original one in 2013 listed her other sons, husband (now deceased).   LMOVM to call me back on my cell phone (this was the time he said would be good to call him back per Veronica's message).

## 2015-12-24 NOTE — Telephone Encounter (Signed)
35-40 min conversation with Evelyn White.  He had gotten texts from his mom after neuro visit that she was the same as 2 years ago.  MOCA testing was indeed the same as 2015. Discussed many issues--living alone, driving ([place] laws, road testing that can be done, etc), Constance Holster.  They are looking into independent living options in [place] and [place].  Evelyn White feels that she would be social at these places (unlike what Constance Holster says).  Seems to be doing a little better since taking her meds regularly.  All questions answered.

## 2015-12-31 ENCOUNTER — Other Ambulatory Visit: Payer: Self-pay

## 2015-12-31 NOTE — Patient Outreach (Signed)
[facility]) Care Management  12/31/2015  Evelyn White 04/07/1941 [id number]  RNCM attempted to contact member telephonically x3- Outreach letter send on 2/2. Followed by additional call on 2/16 with no response.  Plan: per protocol RNCM will close case and update Evelyn Booze, LCSW who has also been trying to reach member.  Thea Silversmith, RN, MSN, [place] Coordinator Cell: [phone]

## 2016-01-03 ENCOUNTER — Other Ambulatory Visit: Payer: Self-pay | Admitting: Licensed Clinical Social Worker

## 2016-01-04 DIAGNOSIS — M810 Age-related osteoporosis without current pathological fracture: Secondary | ICD-10-CM | POA: Diagnosis not present

## 2016-01-24 ENCOUNTER — Other Ambulatory Visit: Payer: Self-pay | Admitting: Family Medicine

## 2016-01-24 NOTE — Telephone Encounter (Signed)
Is this ok to refill?  

## 2016-01-25 NOTE — Telephone Encounter (Signed)
It looks like she is your patient

## 2016-01-29 ENCOUNTER — Other Ambulatory Visit: Payer: Self-pay | Admitting: Family Medicine

## 2016-02-14 ENCOUNTER — Other Ambulatory Visit: Payer: Self-pay | Admitting: Family Medicine

## 2016-02-15 ENCOUNTER — Other Ambulatory Visit: Payer: Self-pay | Admitting: Family Medicine

## 2016-02-15 MED ORDER — MEMANTINE HCL 10 MG PO TABS: 10.0000 mg | ORAL_TABLET | Freq: Two times a day (BID) | ORAL | Status: DC

## 2016-02-15 NOTE — Telephone Encounter (Signed)
Dr.Lalonde is this okay to refill Dr.[name] patient

## 2016-02-20 NOTE — Progress Notes (Signed)
No show

## 2016-02-21 ENCOUNTER — Ambulatory Visit (HOSPITAL_BASED_OUTPATIENT_CLINIC_OR_DEPARTMENT_OTHER): Payer: Self-pay | Admitting: Oncology

## 2016-02-21 DIAGNOSIS — C50912 Malignant neoplasm of unspecified site of left female breast: Secondary | ICD-10-CM

## 2016-03-22 ENCOUNTER — Other Ambulatory Visit: Payer: Self-pay | Admitting: Family Medicine

## 2016-04-28 ENCOUNTER — Ambulatory Visit: Payer: Self-pay | Admitting: Oncology

## 2016-04-28 ENCOUNTER — Other Ambulatory Visit: Payer: Self-pay

## 2016-05-03 ENCOUNTER — Other Ambulatory Visit: Payer: Self-pay | Admitting: Oncology

## 2016-05-03 DIAGNOSIS — C50912 Malignant neoplasm of unspecified site of left female breast: Secondary | ICD-10-CM

## 2016-05-04 ENCOUNTER — Encounter: Payer: Self-pay | Admitting: Family Medicine

## 2016-05-04 ENCOUNTER — Ambulatory Visit (INDEPENDENT_AMBULATORY_CARE_PROVIDER_SITE_OTHER): Payer: Medicare Other | Admitting: Family Medicine

## 2016-05-04 VITALS — BP 122/72 | HR 68 | Ht 64.0 in | Wt 139.6 lb

## 2016-05-04 DIAGNOSIS — R4189 Other symptoms and signs involving cognitive functions and awareness: Secondary | ICD-10-CM | POA: Diagnosis not present

## 2016-05-04 DIAGNOSIS — F321 Major depressive disorder, single episode, moderate: Secondary | ICD-10-CM | POA: Diagnosis not present

## 2016-05-04 MED ORDER — MEMANTINE HCL-DONEPEZIL HCL ER 28-10 MG PO CP24: 1.0000 | ORAL_CAPSULE | Freq: Every day | ORAL | Status: AC

## 2016-05-04 NOTE — Patient Instructions (Signed)
  We are going to try and change to a combination medication that contains both the donepezil and the memantine in the extended release form, so that both medications will be in just 1 pill daily (usually taken in the evening).  If your insurance doesn't cover this medication, we will try changing just the memantine to the extended release so that it can just be taken once daily, and continue the donepezil once daily separately.  If neither of these options are covered, then the memantine you currently have at home, which is not the extended release, is supposed to be taken TWICE DAILY.  I'm pretty sure that we will need prior authorization to get the combination product.  This might take a week or so--continue the current medications (and ideally, take the memantine TWICE daily) until we figure out the new plan.

## 2016-05-04 NOTE — Progress Notes (Signed)
Chief Complaint  Patient presents with  . Advice Only    wanted originally to talk about arimidex but that was called in yesterday. They just wanted to touch base while they were here-revised HIPPA today and three children plus Constance Holster are on now.    Patient presents accompanied by her son Evelyn White, from [place].  She stayed with him there for a month, and he is here for a week.  While she was out going to the bathroom he expressed significant concerns about her safety.  He has been observing her.  The other day she left the iron on for over 30 minutes (and denied doing so), leaves the hose on (and denies it, despite the obvious puddles left).  He reports that she gets somewhat combative/agitated when confronted with these behaviors/concerns. He went shopping with her--she made a grocery list, but put things on it that she had MANY of at home, not needed.  She picked up 3 bundles of celery, seeing that it was on her list (and not recalling that she already put it in her cart, and repeated this behavior).  She has a pill box, but he caught her about to take Thursday's pills on Wednesday.  The original appointment was made for concern over arimidex prescription, which she was able to get refilled through her oncologist yesterday. We went through all of her medications for Tim's benefit.  She reports that she has never has taken the namenda twice daily (since finishing the starter pack). Taking all meds just once daily.   She hasn't been able to play much tennis, due to some complaints of knee and back pain.  She reports that her knees are getting better. She has gained some weight since not playing tennis, and thinks that bothers her back.  She plans to start back up with a trainer (may have done this prior to trip to [place]?) who comes to her house.  She has a med checked scheduled next month with me--this is to f/u on the lexapro/depression.  We discussed this today, rather than having her come back next month.   She reports her moods are good, and denies side effects.  There is some concern as to how compliant she may be with taking her meds (she should have either just gotten a refill, or be about to need one, and that is not the case).  She admits to being upset when hearing things she doesn't want to hear--which has to do with not being able to live in her house alone as she has been.  She avoids discussing this toady, and Evelyn White avoids bring this up in front of her today.  PMH, PSH, SH reviewed.  Current Outpatient Prescriptions on File Prior to Visit  Medication Sig Dispense Refill  . anastrozole (ARIMIDEX) 1 MG tablet TAKE 1 TABLET(1 MG) BY MOUTH DAILY 90 tablet 0  . aspirin 81 MG tablet Take 81 mg by mouth daily. Reported on 11/25/2015    . denosumab (PROLIA) 60 MG/ML SOLN injection Inject 60 mg into the skin every 6 (six) months. Administer in upper arm, thigh, or abdomen    . escitalopram (LEXAPRO) 10 MG tablet TAKE 1/2 TABLET BY MOUTH EVERY MORNING. INCREASE TO 1 TABLET AFTER A WEEK IF TOLERATING 30 tablet 3  . Multiple Vitamins-Minerals (MULTIVITAMIN WITH MINERALS) tablet Take 1 tablet by mouth daily. Reported on 11/25/2015    . acetaminophen (TYLENOL) 325 MG tablet Take 325 mg by mouth as needed for mild pain or moderate  pain. Reported on 05/04/2016    . loratadine (LORADAMED) 10 MG tablet Take 10 mg by mouth daily. Reported on 05/04/2016     No current facility-administered medications on file prior to visit.   aricept 10mg  daily namenda 10mg --supposed to be BID, but has only been taking it daily.  No Known Allergies  ROS: no fever, chills, URI symptoms, headaches, dizziness, chest pain, GI or GU complaints.  +some knee and back pain.  +easy bruising--denies any falls or known trauma.  She does take aspirin daily. See HPI  PHYSICAL EXAM: BP 122/72 mmHg  Pulse 68  Ht 5\' 4"  (1.626 m)  Wt 139 lb 9.6 oz (63.322 kg)  BMI 23.95 kg/m2  Pleasant female, smiling, answering questions seemingly  appropriately, but completely inaccurately per son. When asked about her bruises, she states she had them from a fall--referring to the hospitalization she had, when found on the floor; denies any falls. She answers questions authoritatively, but completely wrong (about dates, meds, when things happened). She is in good spirits.  Neck: no lymphadenopathy, thyromegaly or mass Heart: regular rate and rhythm Lungs: clear bilaterally Extremities: no edema Skin: many small bruises on lower legs and left arm  ASSESSMENT/PLAN:  Cognitive decline - discussed placement, med management, encouraged this. Maximize meds by using combination since forgetting 2nd dose of namenda - Plan: Memantine HCl-Donepezil HCl (NAMZARIC) 28-10 MG CP24  Major depressive disorder, single episode, moderate (HCC) - stable overall, but likely to worsen when home environment changed; continue lexapro (refills not yet needed per pt/son)   Counseled extensively re: concerns for her safety.  Recommendations are made out of love and respect for her, caring about her safety, not to upset her. Discussed medication concerns, need for daily assistance. In her absence, I encouraged Tim to continue to work on placement with services to ensure her safety (previously discussed [place] with a la carte options, if staying in [place]).  Discussed possible worsening of moods, and anger, with these changes, and encouraged counseling.  Previously suggested Dr. Casimiro Needle, vs therapist upon his recommendation.  Only taking namenda once daily, rather than twice daily.  ?compliance with other meds. Change to combo product with XR namenda, to take once daily.  If not covered, try to change to namenda XR so all meds can be taken once daily (and continue the aricept daily separately).  30-40 min visit, more than 1/2 spent counseling to patient and son. All questions/concerns answered. HIPPA and paperwork updated to include Tim (there was an issue that  updated forms did NOT include him). Copies given to Evelyn White  We are going to try and change to a combination medication that contains both the donepezil and the memantine in the extended release form, so that both medications will be in just 1 pill daily (usually taken in the evening).  If your insurance doesn't cover this medication, we will try changing just the memantine to the extended release so that it can just be taken once daily, and continue the donepezil once daily separately.  If neither of these options are covered, then the memantine you currently have at home, which is not the extended release, is supposed to be taken TWICE DAILY.  I'm pretty sure that we will need prior authorization to get the combination product.  This might take a week or so--continue the current medications (and ideally, take the memantine TWICE daily) until we figure out the new plan.

## 2016-05-09 ENCOUNTER — Other Ambulatory Visit: Payer: Self-pay | Admitting: Family Medicine

## 2016-05-09 ENCOUNTER — Telehealth: Payer: Self-pay | Admitting: Family Medicine

## 2016-05-09 NOTE — Telephone Encounter (Signed)
Frederico Hamman, Pt's son, called. He would like to speak to you concerning his mother. Frederico Hamman can be reached at [phone].

## 2016-05-10 NOTE — Telephone Encounter (Signed)
9:20:  LMOVM.  I will try back at lunch

## 2016-05-10 NOTE — Telephone Encounter (Signed)
Mother had a big meltdown yesterday regarding not being able to drive.  Wondering what he should do--she can't remember that doctors told her not to drive. Technically, that was for 6 months after the episode she had in January, when she was hospitalized. But, truly, it is not safe for her to be driving. He would like a note stating this, so that she doesn't get angry with him, thinking HE is the one not letting her, but that it is coming from her doctors.  Please write a letter on our letterhead stating--  Due to Penda Kimble's diagnosis of dementia, it is not felt to be safe to allow her to drive.  Please call Frederico Hamman on his cell phone when this letter is ready to be picked up.   (I will try contacting DMV re: reporting/recommending her to be tested prior to being allowed to drive)

## 2016-05-10 NOTE — Telephone Encounter (Signed)
Letter typed. Printed and placed at front desk for pick up. [name] aware. Evelyn White

## 2016-05-11 ENCOUNTER — Telehealth: Payer: Self-pay | Admitting: Family Medicine

## 2016-05-11 NOTE — Telephone Encounter (Signed)
Pt son come during meeting to pick up note, but we was closed, so he wanted mailed, put in mail to be sent out

## 2016-05-12 ENCOUNTER — Telehealth: Payer: Self-pay

## 2016-05-12 NOTE — Telephone Encounter (Signed)
Pt stated that she need to have Dr.[name] call her and would not disclose what it was in regards to.

## 2016-05-12 NOTE — Telephone Encounter (Signed)
Spoke with [name] and with Frederico Hamman.  Advised that the note written was not a legal revocation of her license ([place] law states 6 months from event--it is past 6 months), but a strong suggestion not to drive, as I, as her doctor, don't feel it is safe.  She likely would be fine to drive around the corner to the tennis court.  We discussed how any anxiety/change could trigger worsening confusion (such as someone cutting her off, road construction, etc), which could have a bad outcome.  Encouraged her to pursue, as per her son's suggestions, a different living situation where she has access to social events, shopping, transportation (such as [facility] offers).  She let me know that they have found a place in [place] that offers her these options, and that she has visited and is not unhappy with it.  This should ease her current feelings of isolation.  I also let her know that while she feels she is a burden to others, I'm sure that her son and friends are happy to be picking her up, and would prefer to do that, then to let her drive.  Briefly discussed online shopping that someone could pick up for her, and not an inconvenience.  Questions were answered.  DMV was contacted.  After being transferred and hold x 30 mins, I was shown where the form was online.  Form was printed and will be completed and faxed in.

## 2016-05-18 DIAGNOSIS — M1712 Unilateral primary osteoarthritis, left knee: Secondary | ICD-10-CM | POA: Diagnosis not present

## 2016-05-18 DIAGNOSIS — M1710 Unilateral primary osteoarthritis, unspecified knee: Secondary | ICD-10-CM | POA: Diagnosis not present

## 2016-05-18 DIAGNOSIS — M1711 Unilateral primary osteoarthritis, right knee: Secondary | ICD-10-CM | POA: Diagnosis not present

## 2016-05-30 ENCOUNTER — Encounter: Payer: Self-pay | Admitting: Family Medicine

## 2016-05-30 ENCOUNTER — Ambulatory Visit (INDEPENDENT_AMBULATORY_CARE_PROVIDER_SITE_OTHER): Payer: Medicare Other | Admitting: Family Medicine

## 2016-05-30 VITALS — BP 122/80 | HR 86 | Wt 138.0 lb

## 2016-05-30 DIAGNOSIS — L03818 Cellulitis of other sites: Secondary | ICD-10-CM

## 2016-05-30 MED ORDER — DOXYCYCLINE HYCLATE 100 MG PO TABS: 100.0000 mg | ORAL_TABLET | Freq: Two times a day (BID) | ORAL | 0 refills | Status: AC

## 2016-05-30 NOTE — Progress Notes (Signed)
   Subjective:    Patient ID: Evelyn White, female    DOB: [birth year], 75 y.o.   MRN: [id number]  HPI She injured her right shin 2 days ago sustaining a laceration to the distal third. She treated it at home.   Review of Systems     Objective:   Physical Exam Exam of the right distal third shin shows an abrasion/laceration to the shin area. There is surrounding erythema that is warm and tender to touch.       Assessment & Plan:  Cellulitis of other specified site - Plan: doxycycline (VIBRA-TABS) 100 MG tablet I will place her on doxycycline. Recommend conservative care for this. Recommend he take a picture of it in case there is any question. She is to return here in 2 weeks for follow-up visit. Explained that it could take a much longer period of time for full feeling to occur.

## 2016-06-01 ENCOUNTER — Other Ambulatory Visit: Payer: Self-pay | Admitting: Family Medicine

## 2016-06-02 ENCOUNTER — Telehealth: Payer: Self-pay

## 2016-06-02 NOTE — Telephone Encounter (Signed)
Tyrone Apple- daughter questions if pt can take Namzaric during the day. She states there are multiple times that pt will not remember afternoon medications. Constance Holster makes signs, sets alarms, and even calls her. She questions if there is another time for her to take this medication. [phone]

## 2016-06-02 NOTE — Telephone Encounter (Signed)
Dr.[name] is this okay to refill 

## 2016-06-02 NOTE — Telephone Encounter (Signed)
Advise Constance Holster (who is her friend and neighbor)--we switched to this med so that it can be taken once daily to help with compliance.  If it doesn't cause her any side effects (ie sedation), then it would be fine for her to take in the morning instead

## 2016-06-02 NOTE — Telephone Encounter (Signed)
done

## 2016-06-02 NOTE — Telephone Encounter (Signed)
Spoke with patient- she states that she will leave it for night and she is going to try to take it everyday for 6:00PM with personal phone calls.   If this does not work then she will switch it to the morning. Victorino December

## 2016-06-05 ENCOUNTER — Other Ambulatory Visit: Payer: Self-pay | Admitting: *Deleted

## 2016-06-05 DIAGNOSIS — C50911 Malignant neoplasm of unspecified site of right female breast: Secondary | ICD-10-CM

## 2016-06-05 DIAGNOSIS — D509 Iron deficiency anemia, unspecified: Secondary | ICD-10-CM

## 2016-06-06 ENCOUNTER — Encounter: Payer: Self-pay | Admitting: Oncology

## 2016-06-06 ENCOUNTER — Ambulatory Visit: Payer: Self-pay | Admitting: Oncology

## 2016-06-06 ENCOUNTER — Other Ambulatory Visit: Payer: Self-pay

## 2016-06-08 ENCOUNTER — Encounter: Payer: Medicare Other | Admitting: Family Medicine

## 2016-06-14 ENCOUNTER — Encounter: Payer: Self-pay | Admitting: Family Medicine

## 2016-06-14 ENCOUNTER — Ambulatory Visit (INDEPENDENT_AMBULATORY_CARE_PROVIDER_SITE_OTHER): Payer: Medicare Other | Admitting: Family Medicine

## 2016-06-14 VITALS — BP 112/60 | HR 64 | Temp 99.6°F | Ht 64.0 in | Wt 134.4 lb

## 2016-06-14 DIAGNOSIS — L03818 Cellulitis of other sites: Secondary | ICD-10-CM | POA: Diagnosis not present

## 2016-06-14 NOTE — Patient Instructions (Signed)
Cover the wound with antibacterial ointment and gauze (bacitracin, or triple antibiotic ointment).  Keeping it covered will protect it from the dog licking it, and from re-injury.  It may take a couple of months of very slow improvement-- Be sure to get it checked again if increasing in size, redness, pain, drainage, fevers, red streaks.  It seems to be healing. Complete any antibiotics you may still have left at home (doxycycline).  Good luck with the move!

## 2016-06-14 NOTE — Progress Notes (Signed)
Chief Complaint  Patient presents with  . Follow-up    2 week follow up on cut on leg. Does not feel that it is any better. Still hurting.    She is accompanied by her son, Frederico Hamman.  He exhibits frustration with the lack of communication between him and Pat's friend Constance Holster, who also brings her to visits and helps with her are (but who is out of town a lot).  They both have expressed that she continues to have problems to remember to take her pills, even with the pill boxes set up.  [name] isn't at all sure how much of the antibiotic for the leg infection she has taken, and patient cannot answer this either.  She doesn't know if the wound looks better.  She still has some discomfort.  She reports that her dog likes to lick the wound/scab a lot. She denies any known fever, wound drainage, odor.  Plans have been finalized--she will be moving to [place], living in [facility] in September.    PMH, PSH, SH reviewed  Outpatient Encounter Prescriptions as of 06/14/2016  Medication Sig Note  . acetaminophen (TYLENOL) 325 MG tablet Take 325 mg by mouth as needed for mild pain or moderate pain. Reported on 05/04/2016 03/19/2015: .   . anastrozole (ARIMIDEX) 1 MG tablet TAKE 1 TABLET(1 MG) BY MOUTH DAILY   . aspirin 81 MG tablet Take 81 mg by mouth daily. Reported on 11/25/2015   . denosumab (PROLIA) 60 MG/ML SOLN injection Inject 60 mg into the skin every 6 (six) months. Administer in upper arm, thigh, or abdomen 12/09/2015: Per Dr. Leo Grosser, last given July/August?  . doxycycline (VIBRA-TABS) 100 MG tablet Take 1 tablet (100 mg total) by mouth 2 (two) times daily.   Marland Kitchen escitalopram (LEXAPRO) 10 MG tablet TAKE 1 TABLET BY MOUTH EVERY DAY   . glucosamine-chondroitin 500-400 MG tablet Take 1 tablet by mouth daily.   Marland Kitchen loratadine (LORADAMED) 10 MG tablet Take 10 mg by mouth daily. Reported on 05/04/2016   . Memantine HCl-Donepezil HCl (NAMZARIC) 28-10 MG CP24 Take 1 capsule by mouth at bedtime.   .  Multiple Vitamins-Minerals (MULTIVITAMIN WITH MINERALS) tablet Take 1 tablet by mouth daily. Reported on 11/25/2015    No facility-administered encounter medications on file as of 06/14/2016.    No Known Allergies  ROS:  No known fever, chills, headaches, dizziness, chest pain, nausea, vomiting, diarrhea or other concerns.  +dementia  PHYSICAL EXAM: BP 112/60   Pulse 64   Temp 99.6 F (37.6 C) (Tympanic)   Ht 5\' 4"  (1.626 m)   Wt 134 lb 6.4 oz (61 kg)   BMI 23.07 kg/m   RLE: 6 x 4-4.5cm area of very mild erythema (pink). Wound itself measures 3 x 1cm in size.  There is some minimal dried yellow crusting inferiorly.  No fluctuance, mildly tender. No soft tissue swelling.  Dr. Redmond School was called in to look at the wound (he saw her initially). He recalls that the wound was previously V-shopped, and that it is healing/improving.  ASSESSMENT/PLAN:   Cellulitis of other specified site - improving. Complete course of ABX if not already done so. Bacitracin and bandage. Return immediately if fever, drainage. May need f/u in [place] if nonhealing  woundcare reviewed in detail.

## 2016-07-07 ENCOUNTER — Emergency Department (HOSPITAL_COMMUNITY)
Admission: EM | Admit: 2016-07-07 | Discharge: 2016-07-08 | Disposition: A | Discharge status: Home / Self Care | Payer: Medicare Other | Attending: Emergency Medicine | Admitting: Emergency Medicine

## 2016-07-07 ENCOUNTER — Encounter (HOSPITAL_COMMUNITY): Payer: Self-pay | Admitting: Emergency Medicine

## 2016-07-07 DIAGNOSIS — W1830XA Fall on same level, unspecified, initial encounter: Secondary | ICD-10-CM | POA: Diagnosis not present

## 2016-07-07 DIAGNOSIS — Z7982 Long term (current) use of aspirin: Secondary | ICD-10-CM | POA: Insufficient documentation

## 2016-07-07 DIAGNOSIS — S0101XA Laceration without foreign body of scalp, initial encounter: Secondary | ICD-10-CM | POA: Insufficient documentation

## 2016-07-07 DIAGNOSIS — Z87891 Personal history of nicotine dependence: Secondary | ICD-10-CM | POA: Diagnosis not present

## 2016-07-07 DIAGNOSIS — S199XXA Unspecified injury of neck, initial encounter: Secondary | ICD-10-CM | POA: Diagnosis not present

## 2016-07-07 DIAGNOSIS — W19XXXA Unspecified fall, initial encounter: Secondary | ICD-10-CM

## 2016-07-07 DIAGNOSIS — IMO0002 Reserved for concepts with insufficient information to code with codable children: Secondary | ICD-10-CM

## 2016-07-07 DIAGNOSIS — Z23 Encounter for immunization: Secondary | ICD-10-CM | POA: Diagnosis not present

## 2016-07-07 DIAGNOSIS — Z853 Personal history of malignant neoplasm of breast: Secondary | ICD-10-CM | POA: Diagnosis not present

## 2016-07-07 DIAGNOSIS — Y939 Activity, unspecified: Secondary | ICD-10-CM | POA: Insufficient documentation

## 2016-07-07 DIAGNOSIS — S0990XA Unspecified injury of head, initial encounter: Secondary | ICD-10-CM | POA: Diagnosis not present

## 2016-07-07 DIAGNOSIS — Y999 Unspecified external cause status: Secondary | ICD-10-CM | POA: Insufficient documentation

## 2016-07-07 DIAGNOSIS — Y929 Unspecified place or not applicable: Secondary | ICD-10-CM | POA: Diagnosis not present

## 2016-07-07 MED ORDER — TETANUS-DIPHTH-ACELL PERTUSSIS 5-2.5-18.5 LF-MCG/0.5 IM SUSP
0.5000 mL | Freq: Once | INTRAMUSCULAR | Status: AC
  Administered 2016-07-08: 0.5 mL via INTRAMUSCULAR
  Filled 2016-07-07: qty 0.5

## 2016-07-07 MED ORDER — LIDOCAINE-EPINEPHRINE-TETRACAINE (LET) SOLUTION
3.0000 mL | Freq: Once | NASAL | Status: AC
  Administered 2016-07-08: 3 mL via TOPICAL
  Filled 2016-07-07: qty 3

## 2016-07-07 NOTE — ED Provider Notes (Addendum)
[facility] DEPT Provider Note   CSN: [id number] Arrival date & time: 07/07/16  2006  By signing my name below, I, [name], attest that this documentation has been prepared under the direction and in the presence of Christalyn Goertz, MD. Electronically Signed: [name], ED Scribe. 07/07/16. 11:53 PM.     History   Chief Complaint Chief Complaint  Patient presents with  . Fall  . Laceration   LEVEL 5 CAVEAT: HPI and ROS limited due to dementia     HPI Evelyn White is a 75 y.o. female with h/o dementia who presents to the Emergency Department complaining of a laceration with controlled bleeding to the dorsal scalp s/p mechanical fall that occurred just PTA. Pts neighbor states the pt likely tripped on carpeting, fell backward and struck her head on a chair prior to landing on the floor. She denies LOC. Bleeding controlled with bandage applied PTA. Pt denies taking any daily anticoagulants. Tdap out of date. She denies emesis, seizure-like activity, additional injuries.   The history is provided by the patient and a friend. History limited by: dementia. No language interpreter was used.  Fall  This is a new problem. The current episode started less than 1 hour ago.  Laceration   The incident occurred less than 1 hour ago. The laceration is located on the scalp. The laceration is 3 cm in size. Injury mechanism: fall. Her tetanus status is out of date.    Past Medical History:  Diagnosis Date  . Anemia 05/2009   iron deficient  . BCC (basal cell carcinoma)    RLE-Dr.Lomax  . Breast cancer ([place]) 1995   h/o left breast CA-s/p lumpectomy,radiation and Tamoxifen x 5years  . Breast cancer ([facility]) 2016   ILC of right breast  . Colon polyps 5/06   hyperplastic  . Gastric polyps 06/2009   small(Dr.Mann) EGD and Colonoscopy   . GERD (gastroesophageal reflux disease)   . Left knee pain    h/o-Dr.Collins in past, not recently  . OA (osteoarthritis)    hands,feet  .  Osteoporosis    managed by Dr. Leo Grosser  . Varicose veins     Patient Active Problem List   Diagnosis Date Noted  . Mild cognitive impairment 12/21/2015  . Faintness 12/21/2015  . Hyperammonemia ([place]) 11/22/2015  . Dizzinesses 11/21/2015  . Genetic testing 05/03/2015  . Family history of breast cancer in female 05/03/2015  . Breast cancer, left breast ([place]) 04/10/2015  . Breast cancer, right breast ([place]) 04/10/2015  . Cognitive decline 04/10/2015  . Insomnia 03/31/2015  . Major depressive disorder, single episode, moderate ([place]) 03/31/2015  . Atypical lobular hyperplasia of right breast 03/23/2015  . Memory loss 12/10/2013  . Osteoporosis 01/02/2013  . Personal history of breast cancer 01/02/2013  . Iron deficiency anemia 07/18/2012    Past Surgical History:  Procedure Laterality Date  . BREAST LUMPECTOMY  1995   left breast  . CATARACT EXTRACTION, BILATERAL  12/2013   Dr. Luretha Rued  . RADIOACTIVE SEED GUIDED EXCISIONAL BREAST BIOPSY Right 03/25/2015   Procedure: RADIOACTIVE SEED GUIDED RIGHT EXCISIONAL BREAST BIOPSY;  Surgeon: Rolm Bookbinder, MD;  Location: [facility];  Service: General;  Laterality: Right;  . TONSILLECTOMY  age 76  . UPPER GASTROINTESTINAL ENDOSCOPY  08/18/13   large hiatal hernia  . varicose vein treatment  1990's   LLE    OB History    No data available       Home Medications    Prior to  Admission medications   Medication Sig Start Date End Date Taking? Authorizing Provider  acetaminophen (TYLENOL) 325 MG tablet Take 325 mg by mouth as needed for mild pain or moderate pain. Reported on 05/04/2016    Historical Provider, MD  anastrozole (ARIMIDEX) 1 MG tablet TAKE 1 TABLET(1 MG) BY MOUTH DAILY 05/03/16   Chauncey Cruel, MD  aspirin 81 MG tablet Take 81 mg by mouth daily. Reported on 11/25/2015    Historical Provider, MD  denosumab (PROLIA) 60 MG/ML SOLN injection Inject 60 mg into the skin every 6 (six) months. Administer in upper arm, thigh, or  abdomen    Historical Provider, MD  doxycycline (VIBRA-TABS) 100 MG tablet Take 1 tablet (100 mg total) by mouth 2 (two) times daily. 05/30/16   Denita Lung, MD  escitalopram (LEXAPRO) 10 MG tablet TAKE 1 TABLET BY MOUTH EVERY DAY 06/02/16   Rita Ohara, MD  glucosamine-chondroitin 500-400 MG tablet Take 1 tablet by mouth daily.    Historical Provider, MD  loratadine (LORADAMED) 10 MG tablet Take 10 mg by mouth daily. Reported on 05/04/2016    Historical Provider, MD  Memantine HCl-Donepezil HCl (NAMZARIC) 28-10 MG CP24 Take 1 capsule by mouth at bedtime. 05/04/16   Rita Ohara, MD  Multiple Vitamins-Minerals (MULTIVITAMIN WITH MINERALS) tablet Take 1 tablet by mouth daily. Reported on 11/25/2015    Historical Provider, MD    Family History Family History  Problem Relation Age of Onset  . Hypertension Mother   . Osteoporosis Mother   . Alcohol abuse Son   . Breast cancer Paternal Aunt     dx. age unknown; lim info  . Heart disease Maternal Uncle   . Cancer Paternal Grandmother 30    breast cancer  . Diabetes Neg Hx     Social History Social History  Substance Use Topics  . Smoking status: Former Smoker    Packs/day: 0.50    Years: 5.00    Types: Cigarettes    Quit date: 10/23/1981  . Smokeless tobacco: Never Used  . Alcohol use 0.0 oz/week     Comment: 1.5 glasses of red wine daily     Allergies   Review of patient's allergies indicates no known allergies.   Review of Systems Review of Systems  Unable to perform ROS: Dementia    Physical Exam Updated Vital Signs BP 157/66 (BP Location: Right Arm)   Pulse 85   Temp 98.3 F (36.8 C) (Oral)   Resp 18   Ht 5\' 5"  (1.651 m)   Wt 134 lb (60.8 kg)   SpO2 100%   BMI 22.30 kg/m   Physical Exam  Constitutional: She appears well-developed and well-nourished.  HENT:  Head: Normocephalic. Head is without raccoon's eyes and without Battle's sign.  Right Ear: No hemotympanum.  Left Ear: No hemotympanum.  Mouth/Throat:  Oropharynx is clear and moist. No oropharyngeal exudate.  No battle's sign. No raccoon eyes. No hemotympanum bilaterally. There is a vertical Y-shaped, gaping, with exposed fat, wound just right of the crown about an inch in length. No additional wounds. Moist mucous membranes. No exudate.   Eyes: Conjunctivae and EOM are normal. Pupils are equal, round, and reactive to light.  Neck: Normal range of motion. Neck supple. No JVD present. No tracheal deviation present.  No carotid bruits. Trachea midline.   Cardiovascular: Normal rate, regular rhythm and normal heart sounds.  Exam reveals no gallop and no friction rub.   No murmur heard. RRR. PTs 2+ and equal.  Pulmonary/Chest: Effort normal and breath sounds normal. No stridor. No respiratory distress. She has no wheezes. She has no rales.  Lungs CTA bilaterally.   Abdominal: Soft. Bowel sounds are normal. She exhibits no distension. There is no rebound and no guarding.  Musculoskeletal: Normal range of motion.       Cervical back: Normal.       Thoracic back: Normal.       Lumbar back: Normal.  No step-offs, crepitance or deformity of the C, T or L spine.    Lymphadenopathy:    She has no cervical adenopathy.  Neurological: She is alert. She has normal reflexes.  Gait intact  Skin: Skin is warm and dry. Capillary refill takes less than 2 seconds.  No lesions.   Psychiatric: She has a normal mood and affect.  Nursing note and vitals reviewed.    ED Treatments / Results   DIAGNOSTIC STUDIES: Oxygen Saturation is 100% on RA, normal by my interpretation.    COORDINATION OF CARE: 11:52 PM Discussed treatment plan with pt at bedside which includes CT and pt agreed to plan.   Vitals:   07/07/16 2015 07/07/16 2241  BP: 142/61 157/66  Pulse: 109 85  Resp: 16 18  Temp: 98.4 F (36.9 C) 98.3 F (36.8 C)   Results for orders placed or performed during the hospital encounter of [id number]  Basic metabolic panel  Result Value Ref Range     Sodium 139 135 - 145 mmol/L   Potassium 3.2 (L) 3.5 - 5.1 mmol/L   Chloride 106 101 - 111 mmol/L   CO2 20 (L) 22 - 32 mmol/L   Glucose, Bld 150 (H) 65 - 99 mg/dL   BUN 6 6 - 20 mg/dL   Creatinine, Ser 0.68 0.44 - 1.00 mg/dL   Calcium 8.8 (L) 8.9 - 10.3 mg/dL   GFR calc non Af Amer >60 >60 mL/min   GFR calc Af Amer >60 >60 mL/min   Anion gap 13 5 - 15  CBC  Result Value Ref Range   WBC 13.8 (H) 4.0 - 10.5 K/uL   RBC 4.30 3.87 - 5.11 MIL/uL   Hemoglobin 12.4 12.0 - 15.0 g/dL   HCT 39.3 36.0 - 46.0 %   MCV 91.4 78.0 - 100.0 fL   MCH 28.8 26.0 - 34.0 pg   MCHC 31.6 30.0 - 36.0 g/dL   RDW 12.8 11.5 - 15.5 %   Platelets 383 150 - 400 K/uL  Urinalysis, Routine w reflex microscopic (not at [facility])  Result Value Ref Range   Color, Urine YELLOW YELLOW   APPearance CLOUDY (A) CLEAR   Specific Gravity, Urine 1.015 1.005 - 1.030   pH 5.5 5.0 - 8.0   Glucose, UA NEGATIVE NEGATIVE mg/dL   Hgb urine dipstick NEGATIVE NEGATIVE   Bilirubin Urine NEGATIVE NEGATIVE   Ketones, ur >80 (A) NEGATIVE mg/dL   Protein, ur NEGATIVE NEGATIVE mg/dL   Nitrite NEGATIVE NEGATIVE   Leukocytes, UA NEGATIVE NEGATIVE  Lipase, blood  Result Value Ref Range   Lipase 32 11 - 51 U/L  Hepatic function panel  Result Value Ref Range   Total Protein 6.1 (L) 6.5 - 8.1 g/dL   Albumin 3.2 (L) 3.5 - 5.0 g/dL   AST 21 15 - 41 U/L   ALT 16 14 - 54 U/L   Alkaline Phosphatase 84 38 - 126 U/L   Total Bilirubin 0.6 0.3 - 1.2 mg/dL   Bilirubin, Direct <0.1 (L) 0.1 - 0.5 mg/dL  Indirect Bilirubin NOT CALCULATED 0.3 - 0.9 mg/dL  TSH  Result Value Ref Range   TSH 0.395 0.350 - 4.500 uIU/mL  Basic metabolic panel  Result Value Ref Range   Sodium 140 135 - 145 mmol/L   Potassium 3.1 (L) 3.5 - 5.1 mmol/L   Chloride 104 101 - 111 mmol/L   CO2 23 22 - 32 mmol/L   Glucose, Bld 99 65 - 99 mg/dL   BUN <5 (L) 6 - 20 mg/dL   Creatinine, Ser 0.57 0.44 - 1.00 mg/dL   Calcium 8.3 (L) 8.9 - 10.3 mg/dL   GFR calc non Af Amer  >60 >60 mL/min   GFR calc Af Amer >60 >60 mL/min   Anion gap 13 5 - 15  CBC  Result Value Ref Range   WBC 12.4 (H) 4.0 - 10.5 K/uL   RBC 4.38 3.87 - 5.11 MIL/uL   Hemoglobin 13.1 12.0 - 15.0 g/dL   HCT 40.2 36.0 - 46.0 %   MCV 91.8 78.0 - 100.0 fL   MCH 29.9 26.0 - 34.0 pg   MCHC 32.6 30.0 - 36.0 g/dL   RDW 12.9 11.5 - 15.5 %   Platelets 363 150 - 400 K/uL  Vitamin B12  Result Value Ref Range   Vitamin B-12 2,599 (H) 180 - 914 pg/mL  Ammonia  Result Value Ref Range   Ammonia 40 (H) 9 - 35 umol/L  CBG monitoring, ED  Result Value Ref Range   Glucose-Capillary 121 (H) 65 - 99 mg/dL   No results found. Medications  lidocaine-EPINEPHrine-tetracaine (LET) solution (3 mLs Topical Given 07/08/16 0000)  Tdap (BOOSTRIX) injection 0.5 mL (0.5 mLs Intramuscular Given 07/08/16 0000)    Labs (all labs ordered are listed, but only abnormal results are displayed) Labs Reviewed - No data to display  EKG  EKG Interpretation None       Radiology No results found.  Procedures .Marland KitchenLaceration Repair Date/Time: 07/08/2016 4:55 AM Performed by: Veatrice Kells Authorized by: Veatrice Kells   Consent:    Consent obtained:  Verbal   Consent given by:  Patient   Risks discussed:  Infection Anesthesia (see MAR for exact dosages):    Anesthesia method:  Topical application Laceration details:    Location:  Scalp   Scalp location:  Crown   Length (cm):  4   Depth (mm):  2 Repair type:    Repair type:  Simple Pre-procedure details:    Preparation:  Patient was prepped and draped in usual sterile fashion Exploration:    Wound exploration: wound explored through full range of motion     Wound extent: no areolar tissue violation noted     Contaminated: no   Treatment:    Area cleansed with:  Betadine   Amount of cleaning:  Standard   Irrigation solution:  Sterile saline   Irrigation method:  Syringe   Visualized foreign bodies/material removed: no   Post-procedure details:     Dressing:  Bulky dressing   Patient tolerance of procedure:  Tolerated well, no immediate complications   (including critical care time)  Medications Ordered in ED Medications - No data to display   Initial Impression / Assessment and Plan / ED Course  I have reviewed the triage vital signs and the nursing notes.  Pertinent labs & imaging results that were available during my care of the patient were reviewed by me and considered in my medical decision making (see chart for details).  Clinical Course  Final Clinical Impressions(s) / ED Diagnoses   Final diagnoses:  None    New Prescriptions New Prescriptions   No medications on file  Follow up at urgent care in 7 days for staple removal All questions answered to patient's satisfaction. Based on history and exam patient has been appropriately medically screened and emergency conditions excluded. Patient is stable for discharge at this time. Follow up with your PMD for recheck in 2 days and strict return precautions given I personally performed the services described in this documentation, which was scribed in my presence. The recorded information has been reviewed and is accurate.      Veatrice Kells, MD 07/08/16 0236    Caitlyn Buchanan, MD 07/08/16 [id number]

## 2016-07-07 NOTE — ED Triage Notes (Signed)
Pt had a witnessed fall with friend on a carpeted area and hit the back of her head. Pt has a laceration to back of her head. Bleeding controlled at this time. Pt is alert and ox4.

## 2016-07-07 NOTE — ED Notes (Signed)
Family at desk very upset with RN for delay d/t RN informing them they had a room twenty minutes ago and still not moved back. Apologized and attempted to explain, family not content with answer. RN called back to room to see if clean.

## 2016-07-07 NOTE — ED Notes (Signed)
Clean patient head with some saline and a rag.

## 2016-07-07 NOTE — ED Notes (Signed)
Pt up at desk asking for update, apologized for wait and informed that she will be next to go back to a room.

## 2016-07-08 ENCOUNTER — Emergency Department (HOSPITAL_COMMUNITY): Payer: Medicare Other

## 2016-07-08 ENCOUNTER — Encounter (HOSPITAL_COMMUNITY): Payer: Self-pay | Admitting: Emergency Medicine

## 2016-07-08 DIAGNOSIS — S0990XA Unspecified injury of head, initial encounter: Secondary | ICD-10-CM | POA: Diagnosis not present

## 2016-07-08 DIAGNOSIS — S199XXA Unspecified injury of neck, initial encounter: Secondary | ICD-10-CM | POA: Diagnosis not present

## 2016-07-08 NOTE — ED Notes (Signed)
Pt continues to be irritated about the wait time.  Explained delay for CT scan.  Called CT with pt present and confirm patient 2 people in line.  Patient verbalized understanding, given warm blanket.

## 2016-07-09 ENCOUNTER — Other Ambulatory Visit: Payer: Self-pay | Admitting: Oncology

## 2016-08-06 ENCOUNTER — Other Ambulatory Visit: Payer: Self-pay | Admitting: Oncology

## 2016-08-06 DIAGNOSIS — C50912 Malignant neoplasm of unspecified site of left female breast: Secondary | ICD-10-CM

## 2016-08-11 DIAGNOSIS — J189 Pneumonia, unspecified organism: Secondary | ICD-10-CM | POA: Diagnosis not present

## 2016-08-11 DIAGNOSIS — R0602 Shortness of breath: Secondary | ICD-10-CM | POA: Diagnosis not present

## 2016-08-11 DIAGNOSIS — D500 Iron deficiency anemia secondary to blood loss (chronic): Secondary | ICD-10-CM | POA: Diagnosis not present

## 2016-08-11 DIAGNOSIS — K449 Diaphragmatic hernia without obstruction or gangrene: Secondary | ICD-10-CM | POA: Diagnosis not present

## 2016-08-11 DIAGNOSIS — R5383 Other fatigue: Secondary | ICD-10-CM | POA: Diagnosis not present

## 2016-08-11 DIAGNOSIS — K429 Umbilical hernia without obstruction or gangrene: Secondary | ICD-10-CM | POA: Diagnosis not present

## 2016-08-11 DIAGNOSIS — K219 Gastro-esophageal reflux disease without esophagitis: Secondary | ICD-10-CM | POA: Diagnosis not present

## 2016-08-11 DIAGNOSIS — R918 Other nonspecific abnormal finding of lung field: Secondary | ICD-10-CM | POA: Diagnosis not present

## 2016-08-11 DIAGNOSIS — J9601 Acute respiratory failure with hypoxia: Secondary | ICD-10-CM | POA: Diagnosis not present

## 2016-08-11 DIAGNOSIS — J181 Lobar pneumonia, unspecified organism: Secondary | ICD-10-CM | POA: Diagnosis not present

## 2016-08-11 DIAGNOSIS — D649 Anemia, unspecified: Secondary | ICD-10-CM | POA: Diagnosis not present

## 2016-08-11 DIAGNOSIS — Z853 Personal history of malignant neoplasm of breast: Secondary | ICD-10-CM | POA: Diagnosis not present

## 2016-08-11 DIAGNOSIS — D508 Other iron deficiency anemias: Secondary | ICD-10-CM | POA: Diagnosis not present

## 2016-08-11 DIAGNOSIS — G309 Alzheimer's disease, unspecified: Secondary | ICD-10-CM | POA: Diagnosis not present

## 2016-08-11 DIAGNOSIS — R7989 Other specified abnormal findings of blood chemistry: Secondary | ICD-10-CM | POA: Diagnosis not present

## 2016-08-11 DIAGNOSIS — Z7982 Long term (current) use of aspirin: Secondary | ICD-10-CM | POA: Diagnosis not present

## 2016-08-11 DIAGNOSIS — R0600 Dyspnea, unspecified: Secondary | ICD-10-CM | POA: Diagnosis not present

## 2016-08-11 DIAGNOSIS — R911 Solitary pulmonary nodule: Secondary | ICD-10-CM | POA: Diagnosis not present

## 2016-08-11 DIAGNOSIS — Z87891 Personal history of nicotine dependence: Secondary | ICD-10-CM | POA: Diagnosis not present

## 2016-08-11 DIAGNOSIS — M170 Bilateral primary osteoarthritis of knee: Secondary | ICD-10-CM | POA: Diagnosis not present

## 2016-08-28 ENCOUNTER — Telehealth: Payer: Self-pay | Admitting: Family Medicine

## 2016-08-28 NOTE — Telephone Encounter (Signed)
[facility]  req refill for Memantine 10mg    Change of pharmacy

## 2016-08-28 NOTE — Telephone Encounter (Signed)
Spoke with pharmacist and they did not need this medication. Filled for namzaric on 08/23/16 so disregard.

## 2016-09-07 DIAGNOSIS — R0600 Dyspnea, unspecified: Secondary | ICD-10-CM | POA: Diagnosis not present

## 2016-09-07 DIAGNOSIS — K449 Diaphragmatic hernia without obstruction or gangrene: Secondary | ICD-10-CM | POA: Diagnosis not present

## 2016-09-07 DIAGNOSIS — J189 Pneumonia, unspecified organism: Secondary | ICD-10-CM | POA: Diagnosis not present

## 2016-09-07 DIAGNOSIS — D649 Anemia, unspecified: Secondary | ICD-10-CM | POA: Diagnosis not present

## 2016-09-08 DIAGNOSIS — D649 Anemia, unspecified: Secondary | ICD-10-CM | POA: Diagnosis not present

## 2016-09-08 DIAGNOSIS — E559 Vitamin D deficiency, unspecified: Secondary | ICD-10-CM | POA: Diagnosis not present

## 2016-09-25 ENCOUNTER — Telehealth: Payer: Self-pay | Admitting: Family Medicine

## 2016-09-25 NOTE — Telephone Encounter (Signed)
Noted. Please let them know I'm aware, has been on these meds for a while.  She does need to be finding a primary MD in [place]--she may have already done this, and if so, be sure that the new MD is aware of any concerns (these were prescribed initially a while ago)

## 2016-09-25 NOTE — Telephone Encounter (Signed)
Pt's new pharmacy in [place] called and stated that were receiving a drug interaction for lexapro and namzaric. They wanted to make sure you were aware and get verbal. Pharmacy is [facility] in [place] at [phone].

## 2016-09-25 NOTE — Telephone Encounter (Signed)
Spoke with pharmacist and she is using MD at the facility-refills on this med had Dr.[name]'s name on it so they were just letting her know.

## 2016-09-28 DIAGNOSIS — M660 Rupture of popliteal cyst: Secondary | ICD-10-CM | POA: Diagnosis not present

## 2016-09-28 DIAGNOSIS — R2242 Localized swelling, mass and lump, left lower limb: Secondary | ICD-10-CM | POA: Diagnosis not present

## 2016-09-28 DIAGNOSIS — Z87891 Personal history of nicotine dependence: Secondary | ICD-10-CM | POA: Diagnosis not present

## 2016-09-28 DIAGNOSIS — S8991XA Unspecified injury of right lower leg, initial encounter: Secondary | ICD-10-CM | POA: Diagnosis not present

## 2016-09-28 DIAGNOSIS — C50919 Malignant neoplasm of unspecified site of unspecified female breast: Secondary | ICD-10-CM | POA: Diagnosis not present

## 2016-09-28 DIAGNOSIS — G309 Alzheimer's disease, unspecified: Secondary | ICD-10-CM | POA: Diagnosis not present

## 2016-09-28 DIAGNOSIS — M79605 Pain in left leg: Secondary | ICD-10-CM | POA: Diagnosis not present

## 2016-09-28 DIAGNOSIS — M7989 Other specified soft tissue disorders: Secondary | ICD-10-CM | POA: Diagnosis not present

## 2016-09-29 DIAGNOSIS — D649 Anemia, unspecified: Secondary | ICD-10-CM | POA: Diagnosis not present

## 2016-10-27 DIAGNOSIS — D649 Anemia, unspecified: Secondary | ICD-10-CM | POA: Diagnosis not present

## 2016-11-06 ENCOUNTER — Encounter: Payer: Medicare Other | Admitting: Family Medicine

## 2016-11-13 ENCOUNTER — Other Ambulatory Visit: Payer: Self-pay | Admitting: Oncology

## 2016-11-13 DIAGNOSIS — C50912 Malignant neoplasm of unspecified site of left female breast: Secondary | ICD-10-CM

## 2016-11-14 DIAGNOSIS — D509 Iron deficiency anemia, unspecified: Secondary | ICD-10-CM | POA: Diagnosis not present

## 2016-11-14 DIAGNOSIS — M170 Bilateral primary osteoarthritis of knee: Secondary | ICD-10-CM | POA: Diagnosis not present

## 2016-11-14 DIAGNOSIS — M25562 Pain in left knee: Secondary | ICD-10-CM | POA: Diagnosis not present

## 2016-11-14 DIAGNOSIS — G301 Alzheimer's disease with late onset: Secondary | ICD-10-CM | POA: Diagnosis not present

## 2016-11-16 ENCOUNTER — Other Ambulatory Visit: Payer: Self-pay | Admitting: *Deleted

## 2016-11-16 ENCOUNTER — Telehealth: Payer: Self-pay | Admitting: Oncology

## 2016-11-16 DIAGNOSIS — Z170 Estrogen receptor positive status [ER+]: Principal | ICD-10-CM

## 2016-11-16 DIAGNOSIS — C50912 Malignant neoplasm of unspecified site of left female breast: Secondary | ICD-10-CM

## 2016-11-16 NOTE — Telephone Encounter (Signed)
Please call son of patient he needs the cancer meds refilled  And has some questions as well [phone]

## 2016-12-05 DIAGNOSIS — M170 Bilateral primary osteoarthritis of knee: Secondary | ICD-10-CM | POA: Diagnosis not present

## 2016-12-15 DIAGNOSIS — D649 Anemia, unspecified: Secondary | ICD-10-CM | POA: Diagnosis not present

## 2016-12-15 DIAGNOSIS — M170 Bilateral primary osteoarthritis of knee: Secondary | ICD-10-CM | POA: Diagnosis not present

## 2016-12-15 DIAGNOSIS — D508 Other iron deficiency anemias: Secondary | ICD-10-CM | POA: Diagnosis not present

## 2016-12-18 DIAGNOSIS — D508 Other iron deficiency anemias: Secondary | ICD-10-CM | POA: Diagnosis not present

## 2016-12-18 DIAGNOSIS — G301 Alzheimer's disease with late onset: Secondary | ICD-10-CM | POA: Diagnosis not present

## 2016-12-18 DIAGNOSIS — M170 Bilateral primary osteoarthritis of knee: Secondary | ICD-10-CM | POA: Diagnosis not present

## 2016-12-19 DIAGNOSIS — M170 Bilateral primary osteoarthritis of knee: Secondary | ICD-10-CM | POA: Diagnosis not present

## 2016-12-20 ENCOUNTER — Ambulatory Visit: Payer: Self-pay | Admitting: Neurology

## 2016-12-26 DIAGNOSIS — M170 Bilateral primary osteoarthritis of knee: Secondary | ICD-10-CM | POA: Diagnosis not present

## 2016-12-27 ENCOUNTER — Other Ambulatory Visit: Payer: Self-pay | Admitting: Family Medicine

## 2016-12-27 DIAGNOSIS — M25561 Pain in right knee: Secondary | ICD-10-CM | POA: Diagnosis not present

## 2016-12-27 DIAGNOSIS — M170 Bilateral primary osteoarthritis of knee: Secondary | ICD-10-CM | POA: Diagnosis not present

## 2016-12-27 DIAGNOSIS — M25562 Pain in left knee: Secondary | ICD-10-CM | POA: Diagnosis not present

## 2016-12-27 NOTE — Telephone Encounter (Signed)
Is this okay to refill? 

## 2016-12-27 NOTE — Telephone Encounter (Signed)
No, deny.  She has a new doctor now in [place]

## 2016-12-28 DIAGNOSIS — M25561 Pain in right knee: Secondary | ICD-10-CM | POA: Diagnosis not present

## 2016-12-28 DIAGNOSIS — M25562 Pain in left knee: Secondary | ICD-10-CM | POA: Diagnosis not present

## 2016-12-28 DIAGNOSIS — M170 Bilateral primary osteoarthritis of knee: Secondary | ICD-10-CM | POA: Diagnosis not present

## 2017-01-01 DIAGNOSIS — M25561 Pain in right knee: Secondary | ICD-10-CM | POA: Diagnosis not present

## 2017-01-01 DIAGNOSIS — M25562 Pain in left knee: Secondary | ICD-10-CM | POA: Diagnosis not present

## 2017-01-01 DIAGNOSIS — M170 Bilateral primary osteoarthritis of knee: Secondary | ICD-10-CM | POA: Diagnosis not present

## 2017-01-02 DIAGNOSIS — M170 Bilateral primary osteoarthritis of knee: Secondary | ICD-10-CM | POA: Diagnosis not present

## 2017-01-03 DIAGNOSIS — M25562 Pain in left knee: Secondary | ICD-10-CM | POA: Diagnosis not present

## 2017-01-03 DIAGNOSIS — M25561 Pain in right knee: Secondary | ICD-10-CM | POA: Diagnosis not present

## 2017-01-03 DIAGNOSIS — M170 Bilateral primary osteoarthritis of knee: Secondary | ICD-10-CM | POA: Diagnosis not present

## 2017-01-09 DIAGNOSIS — M25562 Pain in left knee: Secondary | ICD-10-CM | POA: Diagnosis not present

## 2017-01-09 DIAGNOSIS — M170 Bilateral primary osteoarthritis of knee: Secondary | ICD-10-CM | POA: Diagnosis not present

## 2017-01-09 DIAGNOSIS — M25561 Pain in right knee: Secondary | ICD-10-CM | POA: Diagnosis not present

## 2017-01-11 DIAGNOSIS — M170 Bilateral primary osteoarthritis of knee: Secondary | ICD-10-CM | POA: Diagnosis not present

## 2017-01-11 DIAGNOSIS — M25561 Pain in right knee: Secondary | ICD-10-CM | POA: Diagnosis not present

## 2017-01-11 DIAGNOSIS — M25562 Pain in left knee: Secondary | ICD-10-CM | POA: Diagnosis not present

## 2017-01-17 DIAGNOSIS — M25561 Pain in right knee: Secondary | ICD-10-CM | POA: Diagnosis not present

## 2017-01-17 DIAGNOSIS — M170 Bilateral primary osteoarthritis of knee: Secondary | ICD-10-CM | POA: Diagnosis not present

## 2017-01-17 DIAGNOSIS — M25562 Pain in left knee: Secondary | ICD-10-CM | POA: Diagnosis not present

## 2017-01-19 DIAGNOSIS — M25561 Pain in right knee: Secondary | ICD-10-CM | POA: Diagnosis not present

## 2017-01-19 DIAGNOSIS — M170 Bilateral primary osteoarthritis of knee: Secondary | ICD-10-CM | POA: Diagnosis not present

## 2017-01-19 DIAGNOSIS — M25562 Pain in left knee: Secondary | ICD-10-CM | POA: Diagnosis not present

## 2017-01-31 ENCOUNTER — Telehealth: Payer: Self-pay | Admitting: Family Medicine

## 2017-01-31 NOTE — Telephone Encounter (Signed)
Called pt and left message to call our office to schedule a CPE

## 2017-02-07 NOTE — Telephone Encounter (Signed)
Spoke to pt about scheduling Medicare Wellness Visit. She has been out of town & continues to be out of town and expect to possibly be back in a month or so. She will call to schedule her appt when she know when she will be back.

## 2017-02-12 ENCOUNTER — Other Ambulatory Visit: Payer: Self-pay | Admitting: Oncology

## 2017-02-12 ENCOUNTER — Telehealth: Payer: Self-pay

## 2017-02-12 DIAGNOSIS — C50912 Malignant neoplasm of unspecified site of left female breast: Secondary | ICD-10-CM

## 2017-02-12 NOTE — Telephone Encounter (Signed)
lvm we will not refill anastrazole until pt sees Dr Jana Hakim. Last OV was 07/2015.

## 2017-02-22 IMAGING — DX DG CHEST 2V
2 series · 2 of 2 positions shown · non-contrast
Comparison: None.

CLINICAL DATA: Syncopal episode. Cough. Nausea and vomiting.
Personal history of breast carcinoma.

EXAM:
CHEST  2 VIEW

[chest lat]
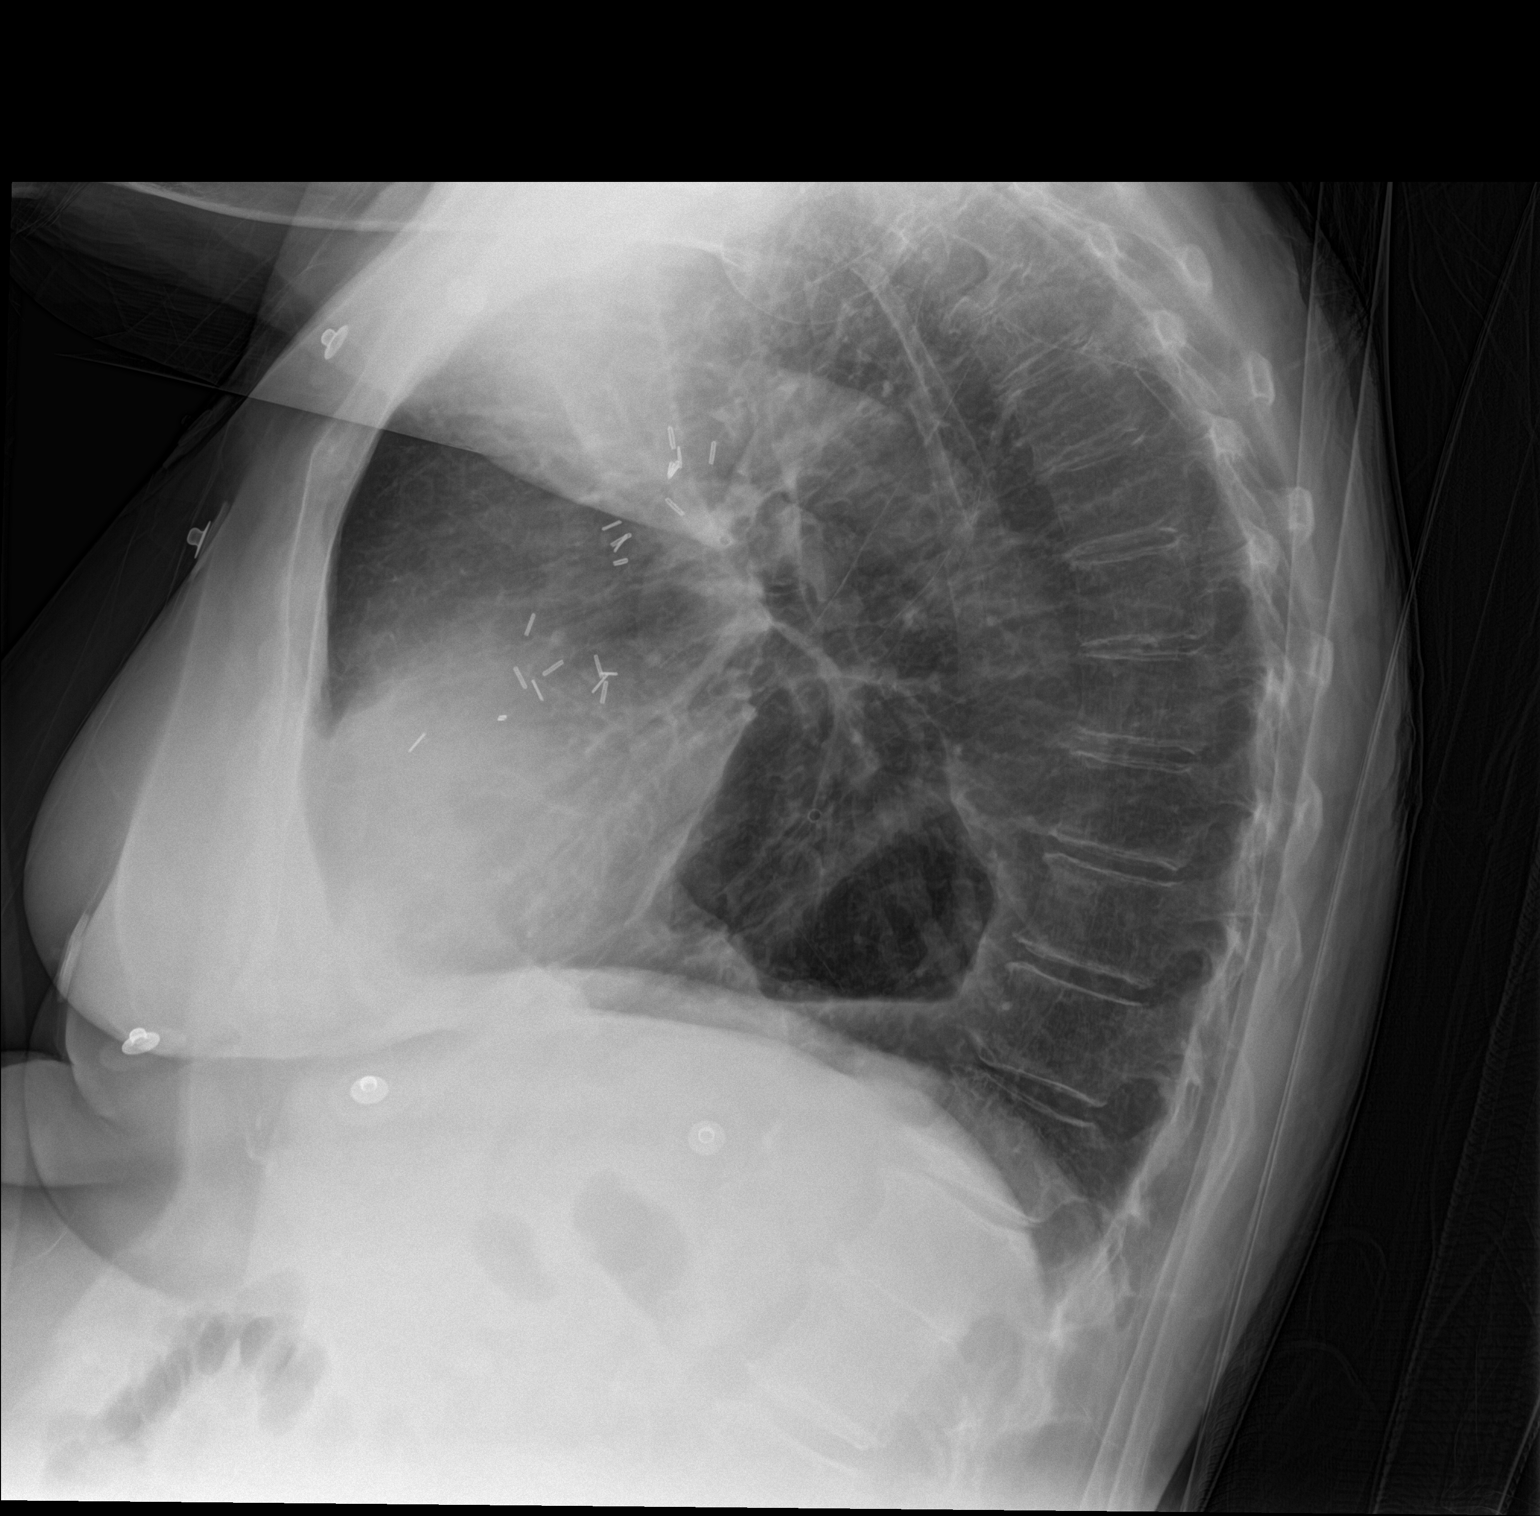

[chest ap]
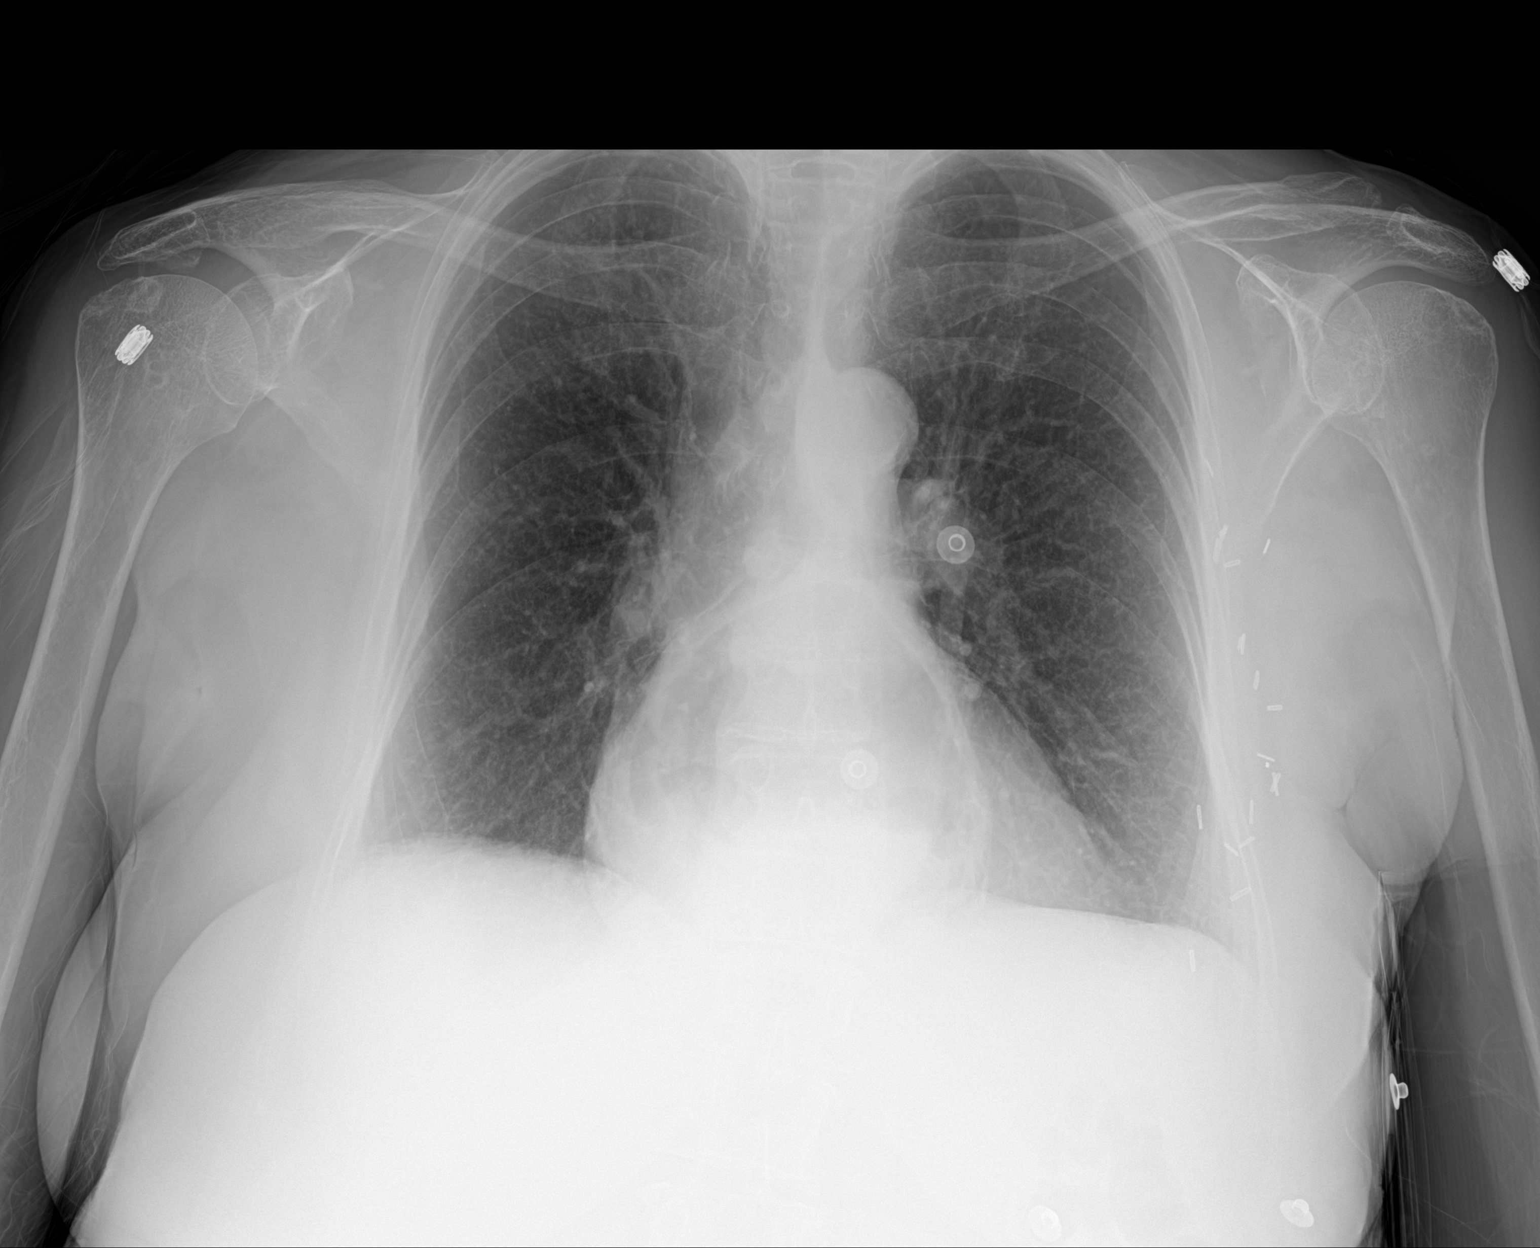

[2 of 2 positions shown; findings below may reference images not displayed]

FINDINGS: Heart size is within normal limits. Moderate to large hiatal hernia
is seen. Both lungs are clear. No evidence of pneumothorax or
pleural effusion. Surgical clips noted within left axilla.
IMPRESSION: No active lung disease.

Moderate to large hiatal hernia.

## 2017-04-06 DIAGNOSIS — H6120 Impacted cerumen, unspecified ear: Secondary | ICD-10-CM | POA: Diagnosis not present

## 2017-04-06 DIAGNOSIS — H9190 Unspecified hearing loss, unspecified ear: Secondary | ICD-10-CM | POA: Diagnosis not present

## 2017-04-30 ENCOUNTER — Other Ambulatory Visit: Payer: Self-pay | Admitting: Family Medicine

## 2017-04-30 DIAGNOSIS — R4189 Other symptoms and signs involving cognitive functions and awareness: Secondary | ICD-10-CM

## 2017-05-01 DIAGNOSIS — S29011A Strain of muscle and tendon of front wall of thorax, initial encounter: Secondary | ICD-10-CM | POA: Diagnosis not present

## 2017-05-01 DIAGNOSIS — M25561 Pain in right knee: Secondary | ICD-10-CM | POA: Diagnosis not present

## 2017-05-01 DIAGNOSIS — D508 Other iron deficiency anemias: Secondary | ICD-10-CM | POA: Diagnosis not present

## 2017-05-01 DIAGNOSIS — G301 Alzheimer's disease with late onset: Secondary | ICD-10-CM | POA: Diagnosis not present

## 2017-05-01 DIAGNOSIS — M25562 Pain in left knee: Secondary | ICD-10-CM | POA: Diagnosis not present

## 2017-06-27 DIAGNOSIS — D509 Iron deficiency anemia, unspecified: Secondary | ICD-10-CM | POA: Diagnosis not present

## 2017-06-27 DIAGNOSIS — C50919 Malignant neoplasm of unspecified site of unspecified female breast: Secondary | ICD-10-CM | POA: Diagnosis not present

## 2017-06-27 DIAGNOSIS — G301 Alzheimer's disease with late onset: Secondary | ICD-10-CM | POA: Diagnosis not present

## 2017-06-27 DIAGNOSIS — R197 Diarrhea, unspecified: Secondary | ICD-10-CM | POA: Diagnosis not present

## 2017-06-29 DIAGNOSIS — R197 Diarrhea, unspecified: Secondary | ICD-10-CM | POA: Diagnosis not present

## 2017-06-29 DIAGNOSIS — D509 Iron deficiency anemia, unspecified: Secondary | ICD-10-CM | POA: Diagnosis not present

## 2017-06-29 DIAGNOSIS — C50919 Malignant neoplasm of unspecified site of unspecified female breast: Secondary | ICD-10-CM | POA: Diagnosis not present

## 2017-07-17 DIAGNOSIS — G309 Alzheimer's disease, unspecified: Secondary | ICD-10-CM | POA: Diagnosis not present

## 2017-07-31 DIAGNOSIS — R197 Diarrhea, unspecified: Secondary | ICD-10-CM | POA: Diagnosis not present

## 2017-07-31 DIAGNOSIS — M1710 Unilateral primary osteoarthritis, unspecified knee: Secondary | ICD-10-CM | POA: Diagnosis not present

## 2017-07-31 DIAGNOSIS — G301 Alzheimer's disease with late onset: Secondary | ICD-10-CM | POA: Diagnosis not present

## 2017-07-31 DIAGNOSIS — D509 Iron deficiency anemia, unspecified: Secondary | ICD-10-CM | POA: Diagnosis not present

## 2017-09-18 DIAGNOSIS — G309 Alzheimer's disease, unspecified: Secondary | ICD-10-CM | POA: Diagnosis not present

## 2017-10-10 IMAGING — CT CT CERVICAL SPINE W/O CM
3 of 8 series · 7 of 33 positions shown, 8 images · non-contrast
Comparison: CT of the head performed 11/21/2015, and MRI of the
brain performed 12/06/2013

CLINICAL DATA: Status post fall. Hit right parietal-occipital
region of head, with laceration. Concern for cervical spine injury.
Initial encounter.

EXAM:
CT HEAD WITHOUT CONTRAST
CT CERVICAL SPINE WITHOUT CONTRAST
TECHNIQUE: Multidetector CT imaging of the head and cervical spine was
performed following the standard protocol without intravenous
contrast. Multiplanar CT image reconstructions of the cervical spine
were also generated.

[Series 304: orthogonals · axial · 0.33mm/px · z∈[+502,+603]mm · 3 of 103 slices shown, 4 images]
[im 26/103  soft-tissue]
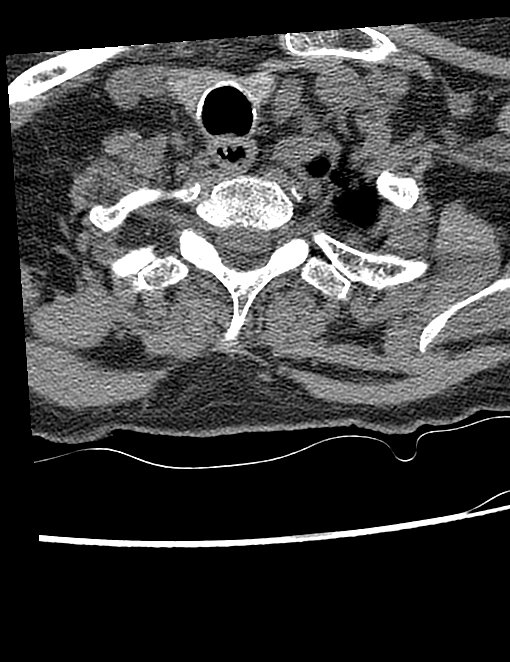
[im 26/103  bone]
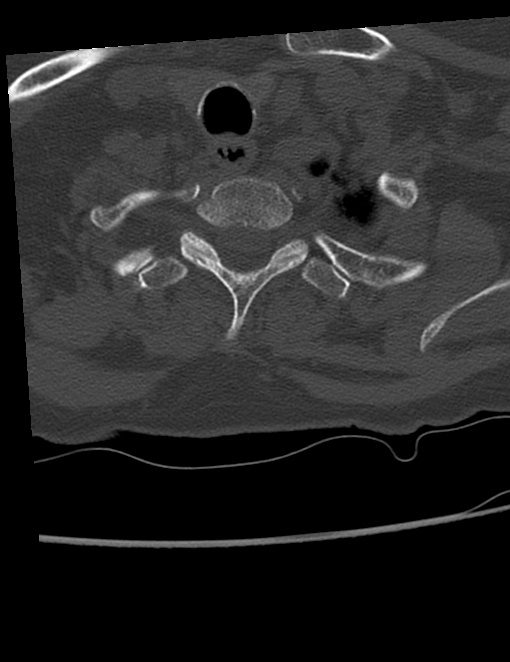
[im 52/103  bone]
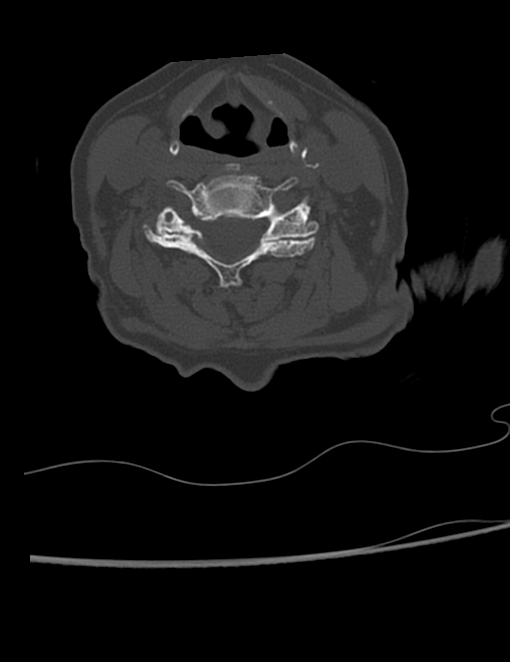
[im 77/103  bone]
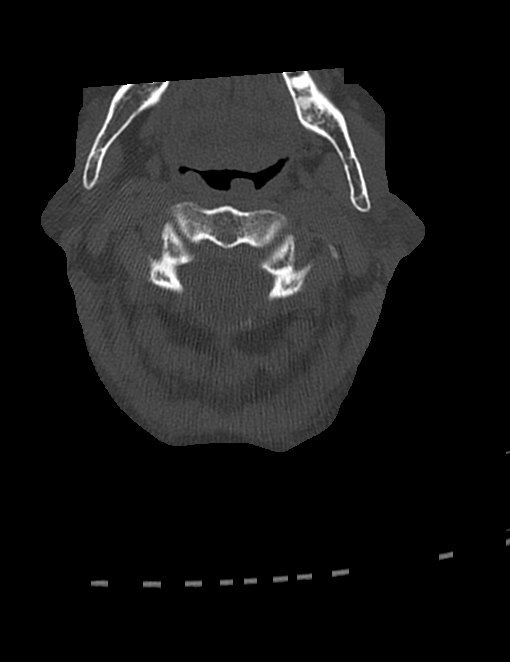

[Series 305: coronals · coronal · 0.33mm/px · 1 of 53 slices shown]
[im 27/53  bone]
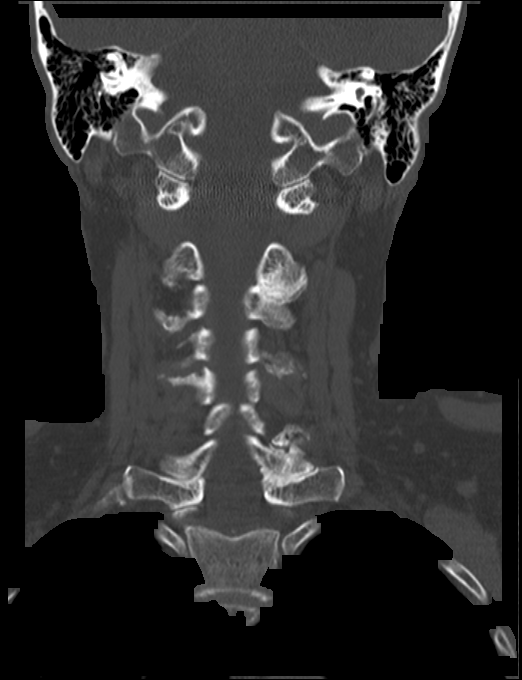

[Series 306: sagittal · sagittal · 0.33mm/px · 3 of 50 slices shown]
[im 13/50  bone]
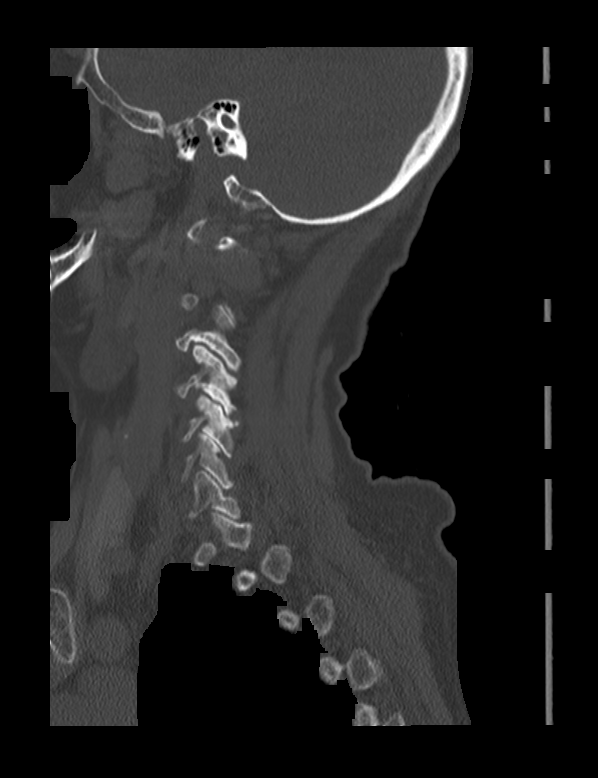
[im 25/50  bone]
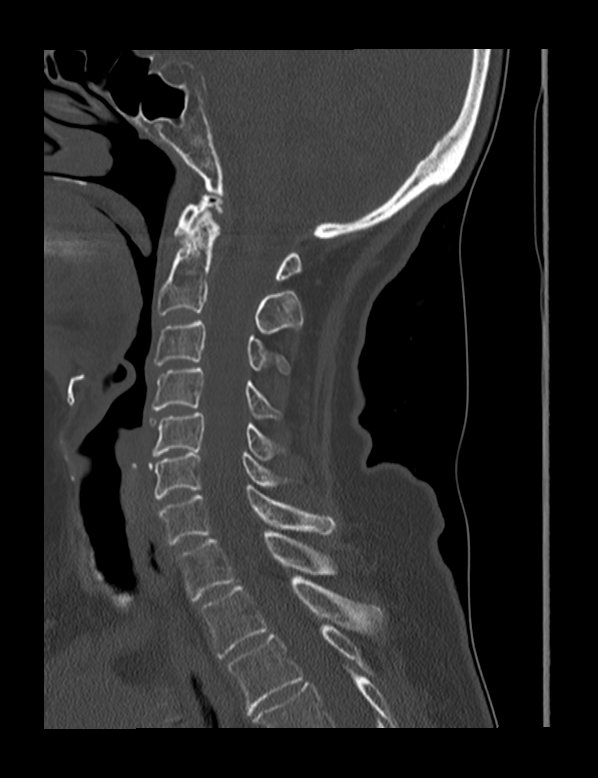
[im 37/50  bone]
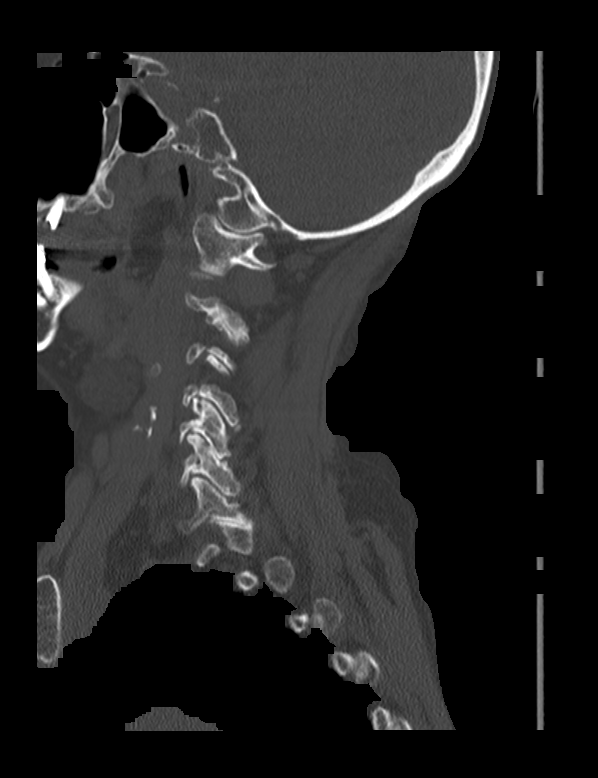

[7 of 33 positions shown; findings below may reference images not displayed]

FINDINGS: CT HEAD FINDINGS

Brain: No evidence of acute infarction, hemorrhage, hydrocephalus,
extra-axial collection or mass lesion/mass effect.

Prominence of ventricles and sulci reflects moderate cortical volume
loss. Scattered periventricular white matter change likely reflects
small vessel ischemic microangiopathy.

The brainstem and fourth ventricle are within normal limits. The
basal ganglia are unremarkable in appearance. The cerebral
hemispheres demonstrate grossly normal gray-white differentiation.
No mass effect or midline shift is seen.

Vascular: No hyperdense vessel or unexpected calcification.

Skull: There is no evidence of fracture; visualized osseous
structures are unremarkable in appearance.

Sinuses/Orbits: The orbits are within normal limits. The paranasal
sinuses and mastoid air cells are well-aerated.

Other: A soft tissue laceration is noted overlying the right
parietal calvarium.

CT CERVICAL SPINE FINDINGS

Alignment: There is minimal grade 1 anterolisthesis of C4 on C5,
chronic in appearance, with underlying facet disease along the
cervical spine.

Skull base and vertebrae: No acute fracture. No primary bone lesion
or focal pathologic process.

Soft tissues and spinal canal: No prevertebral fluid or swelling. No
visible canal hematoma.

Disc levels: Minimal disc space narrowing is noted at C5-C6. The
bony foramina are grossly unremarkable in appearance.

Upper chest: Mild calcification is noted at the carotid bifurcations
bilaterally. The visualized lung apices are grossly clear.

Other: Mild degenerative change is noted at the right
temporomandibular joint, with cortical irregularity at the right
mandibular condylar head.
IMPRESSION: 1. No evidence of traumatic intracranial injury or fracture.
2. No evidence of fracture or acute subluxation along the cervical
spine.
3. Soft tissue laceration overlying the right parietal calvarium.
4. Minimal degenerative change noted along the cervical spine.
5. Moderate cortical volume loss and scattered small vessel ischemic
microangiopathy.
6. Mild degenerative change at the right temporomandibular joint.
7. Mild calcification at the carotid bifurcations bilaterally.
Carotid ultrasound could be considered for further evaluation, when
and as deemed clinically appropriate.
